# Patient Record
Sex: Male | Born: 1955 | Race: White | Hispanic: No | Marital: Married | Smoking: Former smoker
Health system: Southern US, Community
[De-identification: ages and names within clinical notes are randomized; demographics above are authoritative.]

## PROBLEM LIST (undated history)

## (undated) DIAGNOSIS — K219 Gastro-esophageal reflux disease without esophagitis: Secondary | ICD-10-CM

## (undated) DIAGNOSIS — K2270 Barrett's esophagus without dysplasia: Secondary | ICD-10-CM

## (undated) HISTORY — PX: SPINE SURGERY: SHX786

## (undated) HISTORY — DX: Barrett's esophagus without dysplasia: K22.70

## (undated) HISTORY — DX: Gastro-esophageal reflux disease without esophagitis: K21.9

## (undated) HISTORY — PX: GANGLION CYST EXCISION: SHX1691

---

## 2014-05-06 LAB — PULMONARY FUNCTION TEST

## 2015-05-04 ENCOUNTER — Telehealth: Payer: Self-pay | Admitting: Family Medicine

## 2015-05-05 ENCOUNTER — Encounter (INDEPENDENT_AMBULATORY_CARE_PROVIDER_SITE_OTHER): Payer: Self-pay

## 2015-05-05 ENCOUNTER — Ambulatory Visit (INDEPENDENT_AMBULATORY_CARE_PROVIDER_SITE_OTHER): Payer: Medicare Other | Admitting: Family Medicine

## 2015-05-05 ENCOUNTER — Encounter: Payer: Self-pay | Admitting: Family Medicine

## 2015-05-05 VITALS — BP 141/78 | HR 63 | Temp 97.4°F | Ht 69.5 in | Wt 214.0 lb

## 2015-05-05 DIAGNOSIS — M5441 Lumbago with sciatica, right side: Secondary | ICD-10-CM

## 2015-05-05 DIAGNOSIS — F329 Major depressive disorder, single episode, unspecified: Secondary | ICD-10-CM

## 2015-05-05 DIAGNOSIS — N3281 Overactive bladder: Secondary | ICD-10-CM | POA: Diagnosis not present

## 2015-05-05 DIAGNOSIS — M5442 Lumbago with sciatica, left side: Secondary | ICD-10-CM

## 2015-05-05 DIAGNOSIS — R251 Tremor, unspecified: Secondary | ICD-10-CM | POA: Diagnosis not present

## 2015-05-05 DIAGNOSIS — F32A Depression, unspecified: Secondary | ICD-10-CM

## 2015-05-05 LAB — POCT CBC
GRANULOCYTE PERCENT: 65.6 % (ref 37–80)
HEMATOCRIT: 41.1 % — AB (ref 43.5–53.7)
Hemoglobin: 14.5 g/dL (ref 14.1–18.1)
Lymph, poc: 2.2 (ref 0.6–3.4)
MCH, POC: 31.2 pg (ref 27–31.2)
MCHC: 35.2 g/dL (ref 31.8–35.4)
MCV: 88.8 fL (ref 80–97)
MPV: 6.2 fL (ref 0–99.8)
POC GRANULOCYTE: 5.2 (ref 2–6.9)
POC LYMPH PERCENT: 27.9 %L (ref 10–50)
Platelet Count, POC: 226 10*3/uL (ref 142–424)
RBC: 4.63 M/uL — AB (ref 4.69–6.13)
RDW, POC: 13.2 %
WBC: 8 10*3/uL (ref 4.6–10.2)

## 2015-05-05 MED ORDER — DULOXETINE HCL 30 MG PO CPEP: 30.0000 mg | ORAL_CAPSULE | Freq: Every day | ORAL | Status: DC

## 2015-05-05 NOTE — Telephone Encounter (Signed)
Patient has [facility] complete. He is currently taking lexapro, hydrocodone 10/325, oxybutnin, nexium, and pepcid. Patient aware that we do not prescribe chronic pain medication and is aware that we will refer him to pain management. Appointment given for today with [facility] since we did have a cancellation.

## 2015-05-05 NOTE — Progress Notes (Signed)
Subjective:  Patient ID: Austin Huber, male    DOB: Sep 25, 1956  Age: 59 y.o. MRN: [id number]  CC: Establish Care   HPI Drury Ardizzone presents for new patient evaluation. He is having a problem with a tremor. This has been gradually increasing over the last several weeks. He has a chronic back pain patient. He has had spinal surgery but currently is not taking any medication for his pain. He does have frequent urination and urinates 5-7 times a night he says. Frequency during the day as well no pain no discharge from the penis. The tremor does not involve his head or legs. It does not seem to be affected by anything he does either to improve it or make it worse. He is a very heavy coffee drinker at least 8-10 cups daily. He does have some problems with reflux and heartburn. He takes Nexium but he takes it with food. He recently relocated here from [place]. He has a sister in the [place]. There have been multiple situations that have led him to feel despondent and withdrawn and irritable as well as sad and depressed.  History [name] has a past medical history of Barrett esophagus and GERD (gastroesophageal reflux disease).   He has past surgical history that includes Spine surgery and Ganglion cyst excision.   His family history includes Aneurysm in his father; Diabetes in his brother, brother, and sister; Heart disease in his brother and brother; Hypertension in his mother; Stroke in his mother.He reports that he quit smoking about 18 months ago. He started smoking about 37 years ago. He does not have any smokeless tobacco history on file. He reports that he does not drink alcohol or use illicit drugs.  No current outpatient prescriptions on file prior to visit.   No current facility-administered medications on file prior to visit.    ROS Review of Systems  Constitutional: Negative for fever, chills and diaphoresis.  HENT: Negative for congestion, rhinorrhea and sore throat.     Respiratory: Negative for cough, shortness of breath and wheezing.   Cardiovascular: Negative for chest pain.  Gastrointestinal: Negative for nausea, vomiting, abdominal pain, diarrhea, constipation and abdominal distention.  Genitourinary: Positive for urgency and frequency. Negative for dysuria and penile pain.  Musculoskeletal: Negative for joint swelling and arthralgias.  Skin: Negative for rash.  Neurological: Negative for seizures, syncope, speech difficulty, weakness, numbness and headaches.  Psychiatric/Behavioral: Positive for sleep disturbance, dysphoric mood and decreased concentration. Negative for suicidal ideas and self-injury. The patient is nervous/anxious.     Objective:  BP 141/78 mmHg  Pulse 63  Temp(Src) 97.4 F (36.3 C) (Oral)  Ht 5' 9.5" (1.765 m)  Wt 214 lb (97.07 kg)  BMI 31.16 kg/m2  Physical Exam  Constitutional: He is oriented to person, place, and time. He appears well-developed and well-nourished. No distress.  HENT:  Head: Normocephalic and atraumatic.  Right Ear: External ear normal.  Left Ear: External ear normal.  Nose: Nose normal.  Mouth/Throat: Oropharynx is clear and moist.  Eyes: Conjunctivae and EOM are normal. Pupils are equal, round, and reactive to light.  Neck: Normal range of motion. Neck supple. No thyromegaly present.  Cardiovascular: Normal rate, regular rhythm and normal heart sounds.   No murmur heard. Pulmonary/Chest: Effort normal and breath sounds normal. No respiratory distress. He has no wheezes. He has no rales.  Abdominal: Soft. Bowel sounds are normal. He exhibits no distension. There is no tenderness.  Lymphadenopathy:    He has  no cervical adenopathy.  Neurological: He is alert and oriented to person, place, and time. He has normal reflexes.  Skin: Skin is warm and dry.  Psychiatric: He has a normal mood and affect. His behavior is normal. Judgment and thought content normal.    Assessment & Plan:   Jarel was seen  today for establish care.  Diagnoses and all orders for this visit:  Tremor Orders: -     POCT CBC -     Sedimentation rate -     Vitamin B12 -     Vit D  25 hydroxy (rtn osteoporosis monitoring) -     Folate  Overactive bladder Orders: -     CMP14+EGFR  Depression  Bilateral low back pain with sciatica, sciatica laterality unspecified  Other orders -     DULoxetine (CYMBALTA) 30 MG capsule; Take 1 capsule (30 mg total) by mouth daily. For one week then two daily. Take with a full stomach at suppertime   I have discontinued Mr. Grater escitalopram. I am also having him start on DULoxetine. Additionally, I am having him maintain his famotidine, esomeprazole, oxybutynin, and triamcinolone cream.  Meds ordered this encounter  Medications  . DISCONTD: escitalopram (LEXAPRO) 10 MG tablet    Sig: Take 10 mg by mouth daily.  . famotidine (PEPCID) 20 MG tablet    Sig: Take 20 mg by mouth 2 (two) times daily.  Marland Kitchen esomeprazole (NEXIUM) 40 MG capsule    Sig: Take 40 mg by mouth daily at 12 noon.  Marland Kitchen oxybutynin (DITROPAN) 5 MG tablet    Sig: Take 5 mg by mouth 2 (two) times daily.  Marland Kitchen triamcinolone cream (KENALOG) 0.1 %    Sig: Apply 1 application topically 2 (two) times daily.  . DULoxetine (CYMBALTA) 30 MG capsule    Sig: Take 1 capsule (30 mg total) by mouth daily. For one week then two daily. Take with a full stomach at suppertime    Dispense:  60 capsule    Refill:  0     Follow-up: Return in about 6 weeks (around 06/16/2015).  Claretta Fraise, M.D.

## 2015-05-06 LAB — CMP14+EGFR
A/G RATIO: 1.7 (ref 1.1–2.5)
ALK PHOS: 66 IU/L (ref 39–117)
ALT: 17 IU/L (ref 0–44)
AST: 20 IU/L (ref 0–40)
Albumin: 4.7 g/dL (ref 3.5–5.5)
BUN/Creatinine Ratio: 14 (ref 9–20)
BUN: 13 mg/dL (ref 6–24)
Bilirubin Total: 0.3 mg/dL (ref 0.0–1.2)
CO2: 26 mmol/L (ref 18–29)
CREATININE: 0.96 mg/dL (ref 0.76–1.27)
Calcium: 9.8 mg/dL (ref 8.7–10.2)
Chloride: 100 mmol/L (ref 97–108)
GFR calc Af Amer: 100 mL/min/{1.73_m2} (ref >59)
GFR calc non Af Amer: 86 mL/min/{1.73_m2} (ref >59)
GLOBULIN, TOTAL: 2.7 g/dL (ref 1.5–4.5)
Glucose: 61 mg/dL — ABNORMAL LOW (ref 65–99)
Potassium: 4.8 mmol/L (ref 3.5–5.2)
SODIUM: 139 mmol/L (ref 134–144)
Total Protein: 7.4 g/dL (ref 6.0–8.5)

## 2015-05-06 LAB — VITAMIN B12: Vitamin B-12: 718 pg/mL (ref 211–946)

## 2015-05-06 LAB — FOLATE: Folate: 9.9 ng/mL (ref >3.0)

## 2015-05-06 LAB — SEDIMENTATION RATE: SED RATE: 3 mm/h (ref 0–30)

## 2015-05-06 LAB — VITAMIN D 25 HYDROXY (VIT D DEFICIENCY, FRACTURES): VIT D 25 HYDROXY: 34.7 ng/mL (ref 30.0–100.0)

## 2015-06-02 ENCOUNTER — Ambulatory Visit (INDEPENDENT_AMBULATORY_CARE_PROVIDER_SITE_OTHER): Payer: Medicare Other | Admitting: Family Medicine

## 2015-06-02 ENCOUNTER — Encounter: Payer: Self-pay | Admitting: Family Medicine

## 2015-06-02 VITALS — Temp 98.3°F | Ht 69.5 in | Wt 214.0 lb

## 2015-06-02 DIAGNOSIS — M25569 Pain in unspecified knee: Secondary | ICD-10-CM | POA: Diagnosis not present

## 2015-06-02 MED ORDER — DULOXETINE HCL 30 MG PO CPEP: 30.0000 mg | ORAL_CAPSULE | Freq: Two times a day (BID) | ORAL | Status: DC

## 2015-06-02 NOTE — Progress Notes (Signed)
Subjective:  Patient ID: Austin Huber, male    DOB: 1956/03/15  Age: 59 y.o. MRN: [id number]  CC: Depression   HPI Austin Huber presents for recheck of depression, tremor and urinary incontinence. Also having elevated BP as well as knee pain. Asks to see ortho. [facility] is a "crock." Wife chimes in that his surgeon said he didn't need that, but does want him on opiates since he has had 3 surgeries.  History Austin Huber has a past medical history of Barrett esophagus and GERD (gastroesophageal reflux disease).   He has past surgical history that includes Spine surgery and Ganglion cyst excision.   His family history includes Aneurysm in his father; Diabetes in his brother, brother, and sister; Heart disease in his brother and brother; Hypertension in his mother; Stroke in his mother.He reports that he quit smoking about 19 months ago. He started smoking about 37 years ago. He does not have any smokeless tobacco history on file. He reports that he does not drink alcohol or use illicit drugs.  Outpatient Prescriptions Prior to Visit  Medication Sig Dispense Refill  . esomeprazole (NEXIUM) 40 MG capsule Take 40 mg by mouth daily at 12 noon.    . famotidine (PEPCID) 20 MG tablet Take 20 mg by mouth 2 (two) times daily.    Marland Kitchen oxybutynin (DITROPAN) 5 MG tablet Take 5 mg by mouth 2 (two) times daily.    Marland Kitchen triamcinolone cream (KENALOG) 0.1 % Apply 1 application topically 2 (two) times daily.    . DULoxetine (CYMBALTA) 30 MG capsule Take 1 capsule (30 mg total) by mouth daily. For one week then two daily. Take with a full stomach at suppertime 60 capsule 0   No facility-administered medications prior to visit.    ROS Review of Systems  Constitutional: Negative for fever, chills, diaphoresis and unexpected weight change.  HENT: Negative for congestion, hearing loss, rhinorrhea, sore throat and trouble swallowing.   Respiratory: Negative for cough, chest tightness, shortness of breath and  wheezing.   Gastrointestinal: Negative for nausea, vomiting, abdominal pain, diarrhea, constipation and abdominal distention.  Genitourinary: Negative for dysuria, hematuria and flank pain.  Musculoskeletal: Positive for back pain, joint swelling and arthralgias.  Skin: Negative for rash.  Psychiatric/Behavioral: Negative for dysphoric mood, decreased concentration and agitation. The patient is not nervous/anxious.     Objective:  Temp(Src) 98.3 F (36.8 C) (Oral)  Ht 5' 9.5" (1.765 m)  Wt 214 lb (97.07 kg)  BMI 31.16 kg/m2  BP Readings from Last 3 Encounters:  05/05/15 141/78    Wt Readings from Last 3 Encounters:  06/02/15 214 lb (97.07 kg)  05/05/15 214 lb (97.07 kg)     Physical Exam  Constitutional: He is oriented to person, place, and time. He appears well-developed and well-nourished. No distress.  HENT:  Head: Normocephalic and atraumatic.  Right Ear: External ear normal.  Left Ear: External ear normal.  Nose: Nose normal.  Mouth/Throat: Oropharynx is clear and moist.  Eyes: Conjunctivae and EOM are normal. Pupils are equal, round, and reactive to light.  Neck: Normal range of motion. Neck supple. No thyromegaly present.  Cardiovascular: Normal rate, regular rhythm and normal heart sounds.   No murmur heard. Pulmonary/Chest: Effort normal and breath sounds normal. No respiratory distress. He has no wheezes. He has no rales.  Abdominal: Soft. Bowel sounds are normal. He exhibits no distension. There is no tenderness.  Lymphadenopathy:    He has no cervical adenopathy.  Neurological: He is alert  and oriented to person, place, and time. He has normal reflexes.  Skin: Skin is warm and dry.  Psychiatric: He has a normal mood and affect. His behavior is normal. Judgment and thought content normal.    No results found for: HGBA1C  Lab Results  Component Value Date   WBC 8.0 05/05/2015   HGB 14.5 05/05/2015   HCT 41.1* 05/05/2015   GLUCOSE 61* 05/05/2015   ALT 17  05/05/2015   AST 20 05/05/2015   NA 139 05/05/2015   K 4.8 05/05/2015   CL 100 05/05/2015   CREATININE 0.96 05/05/2015   BUN 13 05/05/2015   CO2 26 05/05/2015    Patient was never admitted.  Assessment & Plan:   Austin Huber was seen today for depression.  Diagnoses and all orders for this visit:  Knee joint pain, unspecified laterality Orders: -     Ambulatory referral to Orthopedic Surgery  Other orders -     DULoxetine (CYMBALTA) 30 MG capsule; Take 1 capsule (30 mg total) by mouth 2 (two) times daily. For one week then two daily. Take with a full stomach at suppertime  Over 30 min. Spent with pt. Over half of which was spent in discussion of pain and appropriate referrals and interventions.    Follow-up: Return if symptoms worsen or fail to improve.  Claretta Fraise, M.D.

## 2015-06-16 ENCOUNTER — Other Ambulatory Visit: Payer: Self-pay | Admitting: *Deleted

## 2015-06-16 MED ORDER — FAMOTIDINE 20 MG PO TABS: 20.0000 mg | ORAL_TABLET | Freq: Two times a day (BID) | ORAL | Status: DC

## 2015-08-11 ENCOUNTER — Other Ambulatory Visit: Payer: Self-pay | Admitting: Family Medicine

## 2015-08-11 MED ORDER — OXYBUTYNIN CHLORIDE 5 MG PO TABS: 5.0000 mg | ORAL_TABLET | Freq: Two times a day (BID) | ORAL | Status: DC

## 2015-08-11 MED ORDER — ESOMEPRAZOLE MAGNESIUM 40 MG PO CPDR: 40.0000 mg | DELAYED_RELEASE_CAPSULE | Freq: Every day | ORAL | Status: DC

## 2015-08-11 NOTE — Telephone Encounter (Signed)
Patient aware that rx has been sent to pharmacy.  °

## 2015-08-12 ENCOUNTER — Other Ambulatory Visit: Payer: Self-pay

## 2015-08-12 MED ORDER — ESOMEPRAZOLE MAGNESIUM 40 MG PO CPDR: 40.0000 mg | DELAYED_RELEASE_CAPSULE | Freq: Every day | ORAL | Status: DC

## 2015-08-21 ENCOUNTER — Telehealth: Payer: Self-pay | Admitting: Family Medicine

## 2015-08-21 DIAGNOSIS — R52 Pain, unspecified: Secondary | ICD-10-CM

## 2015-08-21 NOTE — Telephone Encounter (Signed)
Patient aware that referral for pain clinic has been sent to the pharmacy.  Informed patient if he has not heard anything about referral within a week to two weeks to give [place] a call back.  Patient verbalized understanding.

## 2015-08-21 NOTE — Telephone Encounter (Signed)
That sounds like a great idea.Please put in the referral.

## 2015-08-30 ENCOUNTER — Other Ambulatory Visit: Payer: Self-pay | Admitting: Family Medicine

## 2015-09-03 DIAGNOSIS — Z79899 Other long term (current) drug therapy: Secondary | ICD-10-CM | POA: Diagnosis not present

## 2015-09-03 DIAGNOSIS — G8929 Other chronic pain: Secondary | ICD-10-CM | POA: Diagnosis not present

## 2015-09-03 DIAGNOSIS — M961 Postlaminectomy syndrome, not elsewhere classified: Secondary | ICD-10-CM | POA: Diagnosis not present

## 2015-09-03 DIAGNOSIS — M47816 Spondylosis without myelopathy or radiculopathy, lumbar region: Secondary | ICD-10-CM | POA: Diagnosis not present

## 2015-09-03 DIAGNOSIS — M545 Low back pain: Secondary | ICD-10-CM | POA: Diagnosis not present

## 2015-09-04 ENCOUNTER — Ambulatory Visit (INDEPENDENT_AMBULATORY_CARE_PROVIDER_SITE_OTHER): Payer: Medicare Other | Admitting: Family Medicine

## 2015-09-04 ENCOUNTER — Encounter: Payer: Self-pay | Admitting: Family Medicine

## 2015-09-04 VITALS — BP 192/104 | HR 60 | Temp 98.4°F | Ht 70.0 in | Wt 218.6 lb

## 2015-09-04 DIAGNOSIS — M2550 Pain in unspecified joint: Secondary | ICD-10-CM

## 2015-09-04 DIAGNOSIS — E291 Testicular hypofunction: Secondary | ICD-10-CM | POA: Diagnosis not present

## 2015-09-04 MED ORDER — OXYBUTYNIN CHLORIDE 5 MG PO TABS: 5.0000 mg | ORAL_TABLET | Freq: Two times a day (BID) | ORAL | Status: DC

## 2015-09-04 MED ORDER — ESOMEPRAZOLE MAGNESIUM 40 MG PO CPDR: 40.0000 mg | DELAYED_RELEASE_CAPSULE | Freq: Every day | ORAL | Status: DC

## 2015-09-04 MED ORDER — NEBIVOLOL HCL 10 MG PO TABS: 10.0000 mg | ORAL_TABLET | Freq: Every day | ORAL | Status: DC

## 2015-09-04 MED ORDER — DULOXETINE HCL 30 MG PO CPEP: 30.0000 mg | ORAL_CAPSULE | Freq: Two times a day (BID) | ORAL | Status: DC

## 2015-09-04 NOTE — Progress Notes (Signed)
Subjective:  Patient ID: Austin Huber, male    DOB: [birth year]  Age: 59 y.o. MRN: [id number]  CC: Depression; Joint Pain; Over Active Bladder; and Gastroesophageal Reflux   HPI Austin Huber presents for  follow-up of hypertension. Patient has no history of headache chest pain or shortness of breath or recent cough. Patient also denies symptoms of TIA such as numbness weakness lateralizing. Patient checks  blood pressure at home and has not had any elevated readings recently. Patient denies side effects from his medication. States taking it regularly.  Son had Lyme Dx. Pt. Having 8/10 pain at fingers, wrists and feet. Aching with shooting sensation. Hard to walk on it. Duloxetine not causing side effects. Feeling better. Depressed, down and out feeling is much better.Has to separate the dose to BID to avoid nausea.   Saw pain management yesterday and resumed hydrocodone. Only takes it when he has to. Says people are turning to heroin.  Patient in for follow-up of GERD. Currently asymptomatic taking  PPI daily. There is no chest pain or heartburn. No hematemesis and no melena. No dysphagia or choking. Onset is remote. Progression is stable. Complicating factors, none.    History Austin Huber has a past medical history of Barrett esophagus and GERD (gastroesophageal reflux disease).   He has past surgical history that includes Spine surgery and Ganglion cyst excision.   His family history includes Aneurysm in his father; Aortic aneurysm in his mother; Diabetes in his brother, brother, and sister; Heart disease in his brother and brother; Hypertension in his mother; Stroke in his father.He reports that he quit smoking about 22 months ago. He started smoking about 37 years ago. He does not have any smokeless tobacco history on file. He reports that he does not drink alcohol or use illicit drugs.  Current Outpatient Prescriptions on File Prior to Visit  Medication Sig Dispense Refill  . famotidine (PEPCID)  20 MG tablet Take 1 tablet (20 mg total) by mouth 2 (two) times daily. 60 tablet 4  . HYDROcodone-acetaminophen (NORCO) 10-325 MG per tablet Take 1 tablet by mouth.    . triamcinolone cream (KENALOG) 0.1 % Apply 1 application topically 2 (two) times daily.     No current facility-administered medications on file prior to visit.    ROS Review of Systems  Constitutional: Negative for fever, chills and diaphoresis.  HENT: Negative for congestion, rhinorrhea and sore throat.   Respiratory: Negative for cough, shortness of breath and wheezing.   Cardiovascular: Negative for chest pain.  Gastrointestinal: Negative for nausea, vomiting, abdominal pain, diarrhea, constipation and abdominal distention.  Genitourinary: Negative for dysuria and frequency.  Musculoskeletal: Negative for joint swelling and arthralgias.  Skin: Negative for rash.  Neurological: Negative for headaches.    Objective:  BP 192/104 mmHg  Pulse 60  Temp(Src) 98.4 F (36.9 C) (Oral)  Ht 5\' 10"  (1.778 m)  Wt 218 lb 9.6 oz (99.156 kg)  BMI 31.37 kg/m2  BP Readings from Last 3 Encounters:  09/04/15 192/104  05/05/15 141/78    Wt Readings from Last 3 Encounters:  09/04/15 218 lb 9.6 oz (99.156 kg)  06/02/15 214 lb (97.07 kg)  05/05/15 214 lb (97.07 kg)     Physical Exam  Constitutional: He is oriented to person, place, and time. He appears well-developed and well-nourished. No distress.  HENT:  Head: Normocephalic and atraumatic.  Right Ear: External ear normal.  Left Ear: External ear normal.  Nose: Nose normal.  Mouth/Throat: Oropharynx is clear and moist.  Eyes: Conjunctivae and EOM are normal. Pupils are equal, round, and reactive to light.  Neck: Normal range of motion. Neck supple. No thyromegaly present.  Cardiovascular: Normal rate, regular rhythm and normal heart sounds.   No murmur heard. Pulmonary/Chest: Effort normal and breath sounds normal. No respiratory distress. He has no wheezes. He has  no rales.  Abdominal: Soft. Bowel sounds are normal. He exhibits no distension. There is no tenderness.  Lymphadenopathy:    He has no cervical adenopathy.  Neurological: He is alert and oriented to person, place, and time. He has normal reflexes.  Skin: Skin is warm and dry.  Psychiatric: He has a normal mood and affect. His behavior is normal. Judgment and thought content normal.    No results found for: HGBA1C  Lab Results  Component Value Date   WBC 8.0 05/05/2015   HGB 14.5 05/05/2015   HCT 41.1* 05/05/2015   GLUCOSE 61* 05/05/2015   ALT 17 05/05/2015   AST 20 05/05/2015   NA 139 05/05/2015   K 4.8 05/05/2015   CL 100 05/05/2015   CREATININE 0.96 05/05/2015   BUN 13 05/05/2015   CO2 26 05/05/2015    Patient was never admitted.  Assessment & Plan:   Austin Huber was seen today for depression, joint pain, over active bladder and gastroesophageal reflux.  Diagnoses and all orders for this visit:  Arthralgia -     Lyme Ab/Western Blot Reflex -     Arthritis Panel -     Rheumatoid factor -     RPR -     Cancel: Sedimentation rate -     Cancel: CBC with Differential/Platelet  Hypogonadism in male -     Testosterone,Free and Total  Other orders -     esomeprazole (NEXIUM) 40 MG capsule; Take 1 capsule (40 mg total) by mouth daily at 12 noon. -     oxybutynin (DITROPAN) 5 MG tablet; Take 1 tablet (5 mg total) by mouth 2 (two) times daily. -     DULoxetine (CYMBALTA) 30 MG capsule; Take 1 capsule (30 mg total) by mouth 2 (two) times daily. -     nebivolol (BYSTOLIC) 10 MG tablet; Take 1 tablet (10 mg total) by mouth daily.   I have changed Austin Huber DULoxetine. I am also having him start on nebivolol. Additionally, I am having him maintain his triamcinolone cream, HYDROcodone-acetaminophen, famotidine, esomeprazole, and oxybutynin.  Meds ordered this encounter  Medications  . esomeprazole (NEXIUM) 40 MG capsule    Sig: Take 1 capsule (40 mg total) by mouth daily at 12  noon.    Dispense:  90 capsule    Refill:  3  . oxybutynin (DITROPAN) 5 MG tablet    Sig: Take 1 tablet (5 mg total) by mouth 2 (two) times daily.    Dispense:  180 tablet    Refill:  1  . DULoxetine (CYMBALTA) 30 MG capsule    Sig: Take 1 capsule (30 mg total) by mouth 2 (two) times daily.    Dispense:  180 capsule    Refill:  1  . nebivolol (BYSTOLIC) 10 MG tablet    Sig: Take 1 tablet (10 mg total) by mouth daily.    Dispense:  30 tablet    Refill:  2     Follow-up: Return in about 1 week (around 09/11/2015) for Arthritis.  Claretta Fraise, M.D.

## 2015-09-07 LAB — ARTHRITIS PANEL
BASOS: 1 %
Basophils Absolute: 0.1 10*3/uL (ref 0.0–0.2)
EOS (ABSOLUTE): 0.2 10*3/uL (ref 0.0–0.4)
EOS: 3 %
HEMOGLOBIN: 14.5 g/dL (ref 12.6–17.7)
Hematocrit: 43 % (ref 37.5–51.0)
IMMATURE GRANS (ABS): 0 10*3/uL (ref 0.0–0.1)
IMMATURE GRANULOCYTES: 0 %
LYMPHS: 33 %
Lymphocytes Absolute: 1.9 10*3/uL (ref 0.7–3.1)
MCH: 28.6 pg (ref 26.6–33.0)
MCHC: 33.7 g/dL (ref 31.5–35.7)
MCV: 85 fL (ref 79–97)
Monocytes Absolute: 0.6 10*3/uL (ref 0.1–0.9)
Monocytes: 10 %
NEUTROS ABS: 3.1 10*3/uL (ref 1.4–7.0)
NEUTROS PCT: 53 %
Platelets: 201 10*3/uL (ref 150–379)
RBC: 5.07 x10E6/uL (ref 4.14–5.80)
RDW: 13.5 % (ref 12.3–15.4)
Rhuematoid fact SerPl-aCnc: <10 IU/mL (ref 0.0–13.9)
SED RATE: 2 mm/h (ref 0–30)
URIC ACID: 6.2 mg/dL (ref 3.7–8.6)
WBC: 5.9 10*3/uL (ref 3.4–10.8)

## 2015-09-07 LAB — TESTOSTERONE,FREE AND TOTAL
TESTOSTERONE FREE: 5 pg/mL — AB (ref 7.2–24.0)
TESTOSTERONE: 353 ng/dL (ref 348–1197)

## 2015-09-07 LAB — LYME AB/WESTERN BLOT REFLEX
LYME DISEASE AB, QUANT, IGM: <0.8 index (ref 0.00–0.79)
Lyme IgG/IgM Ab: <0.91 {ISR} (ref 0.00–0.90)

## 2015-09-07 LAB — RPR: RPR Ser Ql: NONREACTIVE

## 2015-09-08 NOTE — Addendum Note (Signed)
Addended by: Wardell Heath on: 09/08/2015 09:02 AM   Modules accepted: Orders

## 2015-09-11 ENCOUNTER — Encounter: Payer: Self-pay | Admitting: Family Medicine

## 2015-09-11 ENCOUNTER — Ambulatory Visit (INDEPENDENT_AMBULATORY_CARE_PROVIDER_SITE_OTHER): Payer: Medicare Other

## 2015-09-11 ENCOUNTER — Ambulatory Visit (INDEPENDENT_AMBULATORY_CARE_PROVIDER_SITE_OTHER): Payer: Medicare Other | Admitting: Family Medicine

## 2015-09-11 VITALS — BP 139/80 | HR 60 | Temp 97.7°F | Ht 70.0 in | Wt 217.6 lb

## 2015-09-11 DIAGNOSIS — I10 Essential (primary) hypertension: Secondary | ICD-10-CM | POA: Diagnosis not present

## 2015-09-11 DIAGNOSIS — M2550 Pain in unspecified joint: Secondary | ICD-10-CM | POA: Diagnosis not present

## 2015-09-11 NOTE — Addendum Note (Signed)
Addended by: Claretta Fraise on: 09/11/2015 10:37 AM   Modules accepted: Miquel Dunn

## 2015-09-11 NOTE — Progress Notes (Addendum)
Subjective:  Patient ID: Austin Huber, male    DOB: 25-May-1956  Age: 59 y.o. MRN: [id number]  CC: Joint Pain and Hypertension   HPI Austin Huber presents for finger still hurting in spite of the use of hydrocodone. He understands the limitation pain is not the goal of pain management but he would like to have better relief of the fingers. He still having some pain in the knees as well. These pains are chronic. 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 Arthritis panel blood work including Lyme's titer were reviewed with him.   follow-up of hypertension. Patient has no history of headache chest pain or shortness of breath or recent cough. Patient also denies symptoms of TIA such as numbness weakness lateralizing. Patient checks  blood pressure at home and has not had any elevated readings recently. Patient denies side effects from his medication. States taking it regularly.   History Austin Huber has a past medical history of Barrett esophagus and GERD (gastroesophageal reflux disease).   He has past surgical history that includes Spine surgery and Ganglion cyst excision.   His family history includes Aneurysm in his father; Aortic aneurysm in his mother; Diabetes in his brother, brother, and sister; Heart disease in his brother and brother; Hypertension in his mother; Stroke in his father.He reports that he quit smoking about 22 months ago. He started smoking about 37 years ago. He does not have any smokeless tobacco history on file. He reports that he does not drink alcohol or use illicit drugs.  Outpatient Prescriptions Prior to Visit  Medication Sig Dispense Refill  . DULoxetine (CYMBALTA) 30 MG capsule Take 1 capsule (30 mg total) by mouth 2 (two) times daily. 180 capsule 1  . esomeprazole (NEXIUM) 40 MG capsule Take 1 capsule (40 mg total) by mouth daily at 12 noon. 90 capsule 3  . famotidine (PEPCID) 20 MG tablet Take 1 tablet (20 mg total) by mouth 2 (two) times daily. 60 tablet 4    . nebivolol (BYSTOLIC) 10 MG tablet Take 1 tablet (10 mg total) by mouth daily. 30 tablet 2  . oxybutynin (DITROPAN) 5 MG tablet Take 1 tablet (5 mg total) by mouth 2 (two) times daily. 180 tablet 1  . triamcinolone cream (KENALOG) 0.1 % Apply 1 application topically 2 (two) times daily.    Marland Kitchen HYDROcodone-acetaminophen (NORCO) 10-325 MG per tablet Take 1 tablet by mouth.     No facility-administered medications prior to visit.    ROS Review of Systems  Constitutional: Negative for fever, chills and diaphoresis.  HENT: Negative for congestion, rhinorrhea and sore throat.   Respiratory: Negative for cough, shortness of breath and wheezing.   Cardiovascular: Negative for chest pain.  Gastrointestinal: Negative for nausea, vomiting, abdominal pain, diarrhea, constipation and abdominal distention.  Genitourinary: Negative for dysuria and frequency.  Musculoskeletal: Positive for myalgias and arthralgias. Negative for joint swelling.  Skin: Negative for rash.  Neurological: Negative for headaches.    Objective:  BP 139/80 mmHg  Pulse 60  Temp(Src) 97.7 F (36.5 C) (Oral)  Ht 5\' 10"  (1.778 m)  Wt 217 lb 9.6 oz (98.703 kg)  BMI 31.22 kg/m2  BP Readings from Last 3 Encounters:  09/11/15 139/80  09/04/15 192/104  05/05/15 141/78    Wt Readings from Last 3 Encounters:  09/11/15 217 lb 9.6 oz (98.703 kg)  09/04/15 218 lb 9.6 oz (99.156 kg)  06/02/15 214 lb (97.07 kg)     Physical Exam  Constitutional: He  appears well-developed and well-nourished.  HENT:  Head: Normocephalic and atraumatic.  Right Ear: Tympanic membrane and external ear normal. No decreased hearing is noted.  Left Ear: Tympanic membrane and external ear normal. No decreased hearing is noted.  Mouth/Throat: No oropharyngeal exudate or posterior oropharyngeal erythema.  Eyes: Pupils are equal, round, and reactive to light.  Neck: Normal range of motion. Neck supple.  Cardiovascular: Normal rate and regular  rhythm.   No murmur heard. Pulmonary/Chest: Breath sounds normal. No respiratory distress.  Abdominal: Soft. Bowel sounds are normal. He exhibits no mass. There is no tenderness.  Vitals reviewed.   No results found for: HGBA1C  Lab Results  Component Value Date   WBC 5.9 09/04/2015   HGB 14.5 05/05/2015   HCT 43.0 09/04/2015   GLUCOSE 61* 05/05/2015   ALT 17 05/05/2015   AST 20 05/05/2015   NA 139 05/05/2015   K 4.8 05/05/2015   CL 100 05/05/2015   CREATININE 0.96 05/05/2015   BUN 13 05/05/2015   CO2 26 05/05/2015    Patient was never admitted.  Assessment & Plan:   Austin Huber was seen today for joint pain and hypertension.  Diagnoses and all orders for this visit:  Arthralgia -     DG Hand Complete Left; Future -     DG Hand Complete Right; Future  Essential hypertension   I have changed Austin Huber HYDROcodone-acetaminophen. I am also having him maintain his triamcinolone cream, famotidine, esomeprazole, oxybutynin, DULoxetine, and nebivolol.  Meds ordered this encounter  Medications  . HYDROcodone-acetaminophen (NORCO) 10-325 MG tablet    Sig: Take 1 tablet by mouth 2 (two) times daily.    Dispense:  60 tablet   Influenza and Prevnar vaccines administered today  Follow-up: Return in about 3 months (around 12/12/2015).  Claretta Fraise, M.D.

## 2015-09-11 NOTE — Addendum Note (Signed)
Addended by: Claretta Fraise on: 09/11/2015 10:36 AM   Modules accepted: Orders, SmartSet

## 2015-09-17 ENCOUNTER — Telehealth: Payer: Self-pay | Admitting: Family Medicine

## 2015-09-17 DIAGNOSIS — M2550 Pain in unspecified joint: Secondary | ICD-10-CM

## 2015-09-17 NOTE — Telephone Encounter (Signed)
Pt is having continued joint pain and would like some additional labwork He would also like to be checked for RMSF Please advise

## 2015-09-18 NOTE — Telephone Encounter (Signed)
I placed the order. Have him drop by. Thanks, WS.

## 2015-09-18 NOTE — Telephone Encounter (Signed)
Pt is aware and he states he will come by in the morning. Advised pt we are only open until 12 tomorrow. Pt voiced understanding.

## 2015-09-18 NOTE — Addendum Note (Signed)
Addended by: Claretta Fraise on: 09/18/2015 11:23 AM   Modules accepted: Orders

## 2015-09-21 ENCOUNTER — Other Ambulatory Visit (INDEPENDENT_AMBULATORY_CARE_PROVIDER_SITE_OTHER): Payer: Medicare Other

## 2015-09-21 DIAGNOSIS — M2550 Pain in unspecified joint: Secondary | ICD-10-CM | POA: Diagnosis not present

## 2015-09-21 DIAGNOSIS — E291 Testicular hypofunction: Secondary | ICD-10-CM

## 2015-09-21 DIAGNOSIS — B999 Unspecified infectious disease: Secondary | ICD-10-CM

## 2015-09-21 NOTE — Progress Notes (Signed)
Lab only 

## 2015-09-23 ENCOUNTER — Other Ambulatory Visit: Payer: Self-pay | Admitting: Family Medicine

## 2015-09-23 LAB — TESTOSTERONE,FREE AND TOTAL
TESTOSTERONE FREE: 5.9 pg/mL — AB (ref 7.2–24.0)
Testosterone: 302 ng/dL — ABNORMAL LOW (ref 348–1197)

## 2015-09-23 LAB — ROCKY MTN SPOTTED FVR ABS PNL(IGG+IGM)
RMSF IGG: NEGATIVE
RMSF IGM: 0.96 {index} — AB (ref 0.00–0.89)

## 2015-09-23 MED ORDER — ANDROGEL 20.25 MG/1.25GM (1.62%) TD GEL
TRANSDERMAL | Status: DC
Start: 2015-09-23 — End: 2016-03-25

## 2015-09-23 NOTE — Addendum Note (Signed)
Addended by: Shelbie Ammons on: 09/23/2015 12:54 PM   Modules accepted: Orders

## 2015-09-30 DIAGNOSIS — G8929 Other chronic pain: Secondary | ICD-10-CM | POA: Diagnosis not present

## 2015-09-30 DIAGNOSIS — M545 Low back pain: Secondary | ICD-10-CM | POA: Diagnosis not present

## 2015-09-30 DIAGNOSIS — Z79899 Other long term (current) drug therapy: Secondary | ICD-10-CM | POA: Diagnosis not present

## 2015-09-30 DIAGNOSIS — F329 Major depressive disorder, single episode, unspecified: Secondary | ICD-10-CM | POA: Diagnosis not present

## 2015-09-30 DIAGNOSIS — M961 Postlaminectomy syndrome, not elsewhere classified: Secondary | ICD-10-CM | POA: Diagnosis not present

## 2015-10-05 ENCOUNTER — Telehealth: Payer: Self-pay

## 2015-10-05 NOTE — Telephone Encounter (Signed)
Insurance prior authorized Androgel Pump

## 2015-10-21 ENCOUNTER — Telehealth: Payer: Self-pay | Admitting: Family Medicine

## 2015-10-21 NOTE — Telephone Encounter (Signed)
Spoke to pt

## 2015-11-13 DIAGNOSIS — I10 Essential (primary) hypertension: Secondary | ICD-10-CM | POA: Diagnosis not present

## 2015-11-13 DIAGNOSIS — Z01810 Encounter for preprocedural cardiovascular examination: Secondary | ICD-10-CM | POA: Diagnosis not present

## 2015-11-13 DIAGNOSIS — K210 Gastro-esophageal reflux disease with esophagitis: Secondary | ICD-10-CM | POA: Diagnosis not present

## 2015-11-13 DIAGNOSIS — K2270 Barrett's esophagus without dysplasia: Secondary | ICD-10-CM | POA: Diagnosis not present

## 2015-11-18 DIAGNOSIS — K2270 Barrett's esophagus without dysplasia: Secondary | ICD-10-CM | POA: Diagnosis not present

## 2015-11-18 DIAGNOSIS — D131 Benign neoplasm of stomach: Secondary | ICD-10-CM | POA: Diagnosis not present

## 2015-11-18 DIAGNOSIS — Z5181 Encounter for therapeutic drug level monitoring: Secondary | ICD-10-CM | POA: Diagnosis not present

## 2015-11-18 DIAGNOSIS — I10 Essential (primary) hypertension: Secondary | ICD-10-CM | POA: Diagnosis not present

## 2015-11-18 DIAGNOSIS — K2950 Unspecified chronic gastritis without bleeding: Secondary | ICD-10-CM | POA: Diagnosis not present

## 2015-11-18 DIAGNOSIS — K209 Esophagitis, unspecified: Secondary | ICD-10-CM | POA: Diagnosis not present

## 2015-11-23 ENCOUNTER — Other Ambulatory Visit: Payer: Self-pay | Admitting: Family Medicine

## 2015-11-26 DIAGNOSIS — Z79899 Other long term (current) drug therapy: Secondary | ICD-10-CM | POA: Diagnosis not present

## 2015-11-26 DIAGNOSIS — G8929 Other chronic pain: Secondary | ICD-10-CM | POA: Diagnosis not present

## 2015-11-26 DIAGNOSIS — M47816 Spondylosis without myelopathy or radiculopathy, lumbar region: Secondary | ICD-10-CM | POA: Diagnosis not present

## 2015-11-26 DIAGNOSIS — M25569 Pain in unspecified knee: Secondary | ICD-10-CM | POA: Diagnosis not present

## 2015-11-26 DIAGNOSIS — M179 Osteoarthritis of knee, unspecified: Secondary | ICD-10-CM | POA: Diagnosis not present

## 2015-11-26 DIAGNOSIS — M961 Postlaminectomy syndrome, not elsewhere classified: Secondary | ICD-10-CM | POA: Diagnosis not present

## 2015-12-15 ENCOUNTER — Encounter: Payer: Self-pay | Admitting: Family Medicine

## 2015-12-15 ENCOUNTER — Ambulatory Visit (INDEPENDENT_AMBULATORY_CARE_PROVIDER_SITE_OTHER): Payer: Medicare Other | Admitting: Family Medicine

## 2015-12-15 VITALS — BP 151/87 | HR 50 | Temp 97.7°F | Ht 70.0 in | Wt 221.0 lb

## 2015-12-15 DIAGNOSIS — R7303 Prediabetes: Secondary | ICD-10-CM | POA: Diagnosis not present

## 2015-12-15 DIAGNOSIS — E291 Testicular hypofunction: Secondary | ICD-10-CM

## 2015-12-15 DIAGNOSIS — E162 Hypoglycemia, unspecified: Secondary | ICD-10-CM

## 2015-12-15 DIAGNOSIS — R001 Bradycardia, unspecified: Secondary | ICD-10-CM | POA: Diagnosis not present

## 2015-12-15 LAB — POCT GLYCOSYLATED HEMOGLOBIN (HGB A1C): HEMOGLOBIN A1C: 5.7

## 2015-12-15 MED ORDER — VALSARTAN 160 MG PO TABS: 160.0000 mg | ORAL_TABLET | Freq: Every day | ORAL | Status: DC

## 2015-12-15 NOTE — Patient Instructions (Addendum)
Decrease bystolic to 5 mg daily for 1 week, then DC

## 2015-12-15 NOTE — Progress Notes (Signed)
Subjective:  Patient ID: Austin Huber, male    DOB: 1956/10/10  Age: 60 y.o. MRN: [id number]  CC: Hypertension and testosterone deficiency   HPI Austin Huber presents for  follow-up of hypertension. Patient has no history of headache chest pain or shortness of breath or recent cough. Patient also denies symptoms of TIA such as numbness weakness lateralizing. Patient checks  blood pressure at home and hashad  elevated readings recently. Patient notes pulse falls into 40s. Nurse at recent Endo said pulse is too low. Recommended follow up here. States taking med regularly.  Taking testosterone for several months. Not sure if it is helping. Overall feels good. Energy and strength are better.  Pt. Concerned about risk for diabetes due to episodes of shakiness occuring occasionally. Strong family hx of diabetes noted. Would like check for DM. Denies any LOC. Not using glucose for relief. Sx last only a few minutes.    HistoryDale has a past medical history of Barrett esophagus and GERD (gastroesophageal reflux disease).   Austin Huber has past surgical history that includes Spine surgery and Ganglion cyst excision.   His family history includes Aneurysm in his father; Aortic aneurysm in his mother; Diabetes in his brother, brother, and sister; Heart disease in his brother and brother; Hypertension in his mother; Stroke in his father.Austin Huber reports that Austin Huber quit smoking about 2 years ago. Austin Huber started smoking about 37 years ago. Austin Huber does not have any smokeless tobacco history on file. Austin Huber reports that Austin Huber does not drink alcohol or use illicit drugs.  Current Outpatient Prescriptions on File Prior to Visit  Medication Sig Dispense Refill  . ANDROGEL 20.25 MG/1.25GM (1.62%) GEL Apply 2 pumps to the upper chest and shoulders daily for testosterone supplement. 150 g 5  . BYSTOLIC 10 MG tablet TAKE ONE TABLET BY MOUTH ONCE DAILY 30 tablet 3  . DULoxetine (CYMBALTA) 30 MG capsule Take 1 capsule (30 mg total) by mouth 2 (two)  times daily. 180 capsule 1  . esomeprazole (NEXIUM) 40 MG capsule Take 1 capsule (40 mg total) by mouth daily at 12 noon. 90 capsule 3  . famotidine (PEPCID) 20 MG tablet Take 1 tablet (20 mg total) by mouth 2 (two) times daily. 60 tablet 4  . HYDROcodone-acetaminophen (NORCO) 10-325 MG tablet Take 1 tablet by mouth 2 (two) times daily. 60 tablet   . oxybutynin (DITROPAN) 5 MG tablet Take 1 tablet (5 mg total) by mouth 2 (two) times daily. 180 tablet 1  . triamcinolone cream (KENALOG) 0.1 % Apply 1 application topically 2 (two) times daily.     No current facility-administered medications on file prior to visit.    ROS Review of Systems  Constitutional: Negative for fever, chills, diaphoresis and unexpected weight change.  HENT: Negative for congestion, hearing loss, rhinorrhea and sore throat.   Eyes: Negative for visual disturbance.  Respiratory: Negative for cough and shortness of breath.   Cardiovascular: Negative for chest pain.  Gastrointestinal: Negative for abdominal pain, diarrhea and constipation.  Genitourinary: Negative for dysuria and flank pain.  Musculoskeletal: Negative for joint swelling and arthralgias.  Skin: Negative for rash.  Neurological: Negative for dizziness and headaches.  Psychiatric/Behavioral: Negative for sleep disturbance and dysphoric mood.    Objective:  BP 151/87 mmHg  Pulse 50  Temp(Src) 97.7 F (36.5 C) (Oral)  Ht 5' 10"  (1.778 m)  Wt 221 lb (100.245 kg)  BMI 31.71 kg/m2  SpO2 96%  BP Readings from Last 3 Encounters:  12/15/15 151/87  09/11/15  139/80  09/04/15 192/104    Wt Readings from Last 3 Encounters:  12/15/15 221 lb (100.245 kg)  09/11/15 217 lb 9.6 oz (98.703 kg)  09/04/15 218 lb 9.6 oz (99.156 kg)     Physical Exam  Constitutional: Austin Huber is oriented to person, place, and time. Austin Huber appears well-developed and well-nourished. No distress.  HENT:  Head: Normocephalic and atraumatic.  Right Ear: External ear normal.  Left Ear:  External ear normal.  Nose: Nose normal.  Mouth/Throat: Oropharynx is clear and moist.  Eyes: Conjunctivae and EOM are normal. Pupils are equal, round, and reactive to light.  Neck: Normal range of motion. Neck supple. No thyromegaly present.  Cardiovascular: Normal rate, regular rhythm and normal heart sounds.   No murmur heard. Pulmonary/Chest: Effort normal and breath sounds normal. No respiratory distress. Austin Huber has no wheezes. Austin Huber has no rales.  Abdominal: Soft. Bowel sounds are normal. Austin Huber exhibits no distension. There is no tenderness.  Lymphadenopathy:    Austin Huber has no cervical adenopathy.  Neurological: Austin Huber is alert and oriented to person, place, and time. Austin Huber has normal reflexes.  Skin: Skin is warm and dry.  Psychiatric: Austin Huber has a normal mood and affect. His behavior is normal. Judgment and thought content normal.    Lab Results  Component Value Date   HGBA1C 5.7 12/15/2015    Lab Results  Component Value Date   WBC 5.9 09/04/2015   HGB 14.5 05/05/2015   HCT 43.0 09/04/2015   PLT 201 09/04/2015   GLUCOSE 61* 05/05/2015   ALT 17 05/05/2015   AST 20 05/05/2015   NA 139 05/05/2015   K 4.8 05/05/2015   CL 100 05/05/2015   CREATININE 0.96 05/05/2015   BUN 13 05/05/2015   CO2 26 05/05/2015   HGBA1C 5.7 12/15/2015    Patient was never admitted.  Assessment & Plan:   Austin Huber was seen today for hypertension and testosterone deficiency.  Diagnoses and all orders for this visit:  Hypogonadism in male -     CBC with Differential/Platelet -     CMP14+EGFR -     Testosterone,Free and Total  Hypoglycemia -     CBC with Differential/Platelet -     CMP14+EGFR -     POCT glycosylated hemoglobin (Hb A1C)  Bradycardia -     CBC with Differential/Platelet -     CMP14+EGFR  Prediabetes  Other orders -     valsartan (DIOVAN) 160 MG tablet; Take 1 tablet (160 mg total) by mouth daily.   I am having Austin Huber start on valsartan. I am also having him maintain his triamcinolone  cream, famotidine, esomeprazole, oxybutynin, DULoxetine, HYDROcodone-acetaminophen, ANDROGEL, and BYSTOLIC.  Meds ordered this encounter  Medications  . valsartan (DIOVAN) 160 MG tablet    Sig: Take 1 tablet (160 mg total) by mouth daily.    Dispense:  30 tablet    Refill:  1     Follow-up: Return in about 1 month (around 01/15/2016).  Claretta Fraise, M.D.

## 2015-12-16 LAB — CBC WITH DIFFERENTIAL/PLATELET
BASOS: 1 %
Basophils Absolute: 0.1 10*3/uL (ref 0.0–0.2)
EOS (ABSOLUTE): 0.2 10*3/uL (ref 0.0–0.4)
Eos: 4 %
HEMOGLOBIN: 15.2 g/dL (ref 12.6–17.7)
Hematocrit: 43.6 % (ref 37.5–51.0)
IMMATURE GRANS (ABS): 0 10*3/uL (ref 0.0–0.1)
IMMATURE GRANULOCYTES: 0 %
LYMPHS: 32 %
Lymphocytes Absolute: 1.8 10*3/uL (ref 0.7–3.1)
MCH: 30 pg (ref 26.6–33.0)
MCHC: 34.9 g/dL (ref 31.5–35.7)
MCV: 86 fL (ref 79–97)
MONOCYTES: 7 %
Monocytes Absolute: 0.4 10*3/uL (ref 0.1–0.9)
NEUTROS ABS: 3.1 10*3/uL (ref 1.4–7.0)
Neutrophils: 56 %
Platelets: 208 10*3/uL (ref 150–379)
RBC: 5.06 x10E6/uL (ref 4.14–5.80)
RDW: 13.8 % (ref 12.3–15.4)
WBC: 5.6 10*3/uL (ref 3.4–10.8)

## 2015-12-16 LAB — CMP14+EGFR
A/G RATIO: 1.5 (ref 1.1–2.5)
ALBUMIN: 4.6 g/dL (ref 3.5–5.5)
ALT: 20 IU/L (ref 0–44)
AST: 20 IU/L (ref 0–40)
Alkaline Phosphatase: 61 IU/L (ref 39–117)
BILIRUBIN TOTAL: 0.3 mg/dL (ref 0.0–1.2)
BUN / CREAT RATIO: 10 (ref 9–20)
BUN: 10 mg/dL (ref 6–24)
CALCIUM: 9.5 mg/dL (ref 8.7–10.2)
CHLORIDE: 100 mmol/L (ref 96–106)
CO2: 25 mmol/L (ref 18–29)
Creatinine, Ser: 0.97 mg/dL (ref 0.76–1.27)
GFR, EST AFRICAN AMERICAN: 98 mL/min/{1.73_m2} (ref >59)
GFR, EST NON AFRICAN AMERICAN: 85 mL/min/{1.73_m2} (ref >59)
GLUCOSE: 88 mg/dL (ref 65–99)
Globulin, Total: 3 g/dL (ref 1.5–4.5)
Potassium: 4.5 mmol/L (ref 3.5–5.2)
Sodium: 140 mmol/L (ref 134–144)
TOTAL PROTEIN: 7.6 g/dL (ref 6.0–8.5)

## 2015-12-16 LAB — TESTOSTERONE,FREE AND TOTAL
TESTOSTERONE FREE: 4.1 pg/mL — AB (ref 7.2–24.0)
Testosterone: 184 ng/dL — ABNORMAL LOW (ref 348–1197)

## 2016-01-12 ENCOUNTER — Other Ambulatory Visit: Payer: Self-pay | Admitting: Family Medicine

## 2016-01-20 ENCOUNTER — Ambulatory Visit (INDEPENDENT_AMBULATORY_CARE_PROVIDER_SITE_OTHER): Payer: Medicare Other | Admitting: Family Medicine

## 2016-01-20 ENCOUNTER — Encounter: Payer: Self-pay | Admitting: Family Medicine

## 2016-01-20 VITALS — BP 134/74 | HR 64 | Temp 98.5°F | Ht 70.0 in | Wt 218.0 lb

## 2016-01-20 DIAGNOSIS — M5442 Lumbago with sciatica, left side: Secondary | ICD-10-CM | POA: Diagnosis not present

## 2016-01-20 DIAGNOSIS — E291 Testicular hypofunction: Secondary | ICD-10-CM

## 2016-01-20 DIAGNOSIS — M5441 Lumbago with sciatica, right side: Secondary | ICD-10-CM

## 2016-01-20 DIAGNOSIS — M2550 Pain in unspecified joint: Secondary | ICD-10-CM | POA: Diagnosis not present

## 2016-01-20 DIAGNOSIS — M25569 Pain in unspecified knee: Secondary | ICD-10-CM | POA: Diagnosis not present

## 2016-01-20 DIAGNOSIS — I10 Essential (primary) hypertension: Secondary | ICD-10-CM

## 2016-01-20 NOTE — Progress Notes (Signed)
Subjective:  Patient ID: Austin Huber, male    DOB: 26-Feb-1956  Age: 60 y.o. MRN: [id number]  CC: Hypertension   HPI Jayjuan Rylee presents for BP doing better. Med not causing side effects.   Androgel working out okay. Well - covered by insurance.   Left knee pain is worsening. Feels different from the radicular back pain. Goes into medial leg. 8/10 most of the time. Worse with working on them. Bearable with nonweightbearing rest.   History Darnel has a past medical history of Barrett esophagus and GERD (gastroesophageal reflux disease).   He has past surgical history that includes Spine surgery and Ganglion cyst excision.   His family history includes Aneurysm in his father; Aortic aneurysm in his mother; Diabetes in his brother, brother, and sister; Heart disease in his brother and brother; Hypertension in his mother; Stroke in his father.He reports that he quit smoking about 2 years ago. He started smoking about 37 years ago. He does not have any smokeless tobacco history on file. He reports that he does not drink alcohol or use illicit drugs.    ROS Review of Systems  Constitutional: Negative for fever, chills, diaphoresis and unexpected weight change.  HENT: Negative for congestion, hearing loss, rhinorrhea and sore throat.   Eyes: Negative for visual disturbance.  Respiratory: Negative for cough and shortness of breath.   Cardiovascular: Negative for chest pain.  Gastrointestinal: Negative for abdominal pain, diarrhea and constipation.  Genitourinary: Negative for dysuria and flank pain.  Musculoskeletal: Positive for arthralgias (knees, lower back). Negative for joint swelling.  Skin: Negative for rash.  Neurological: Negative for dizziness and headaches.  Psychiatric/Behavioral: Negative for sleep disturbance and dysphoric mood.    Objective:  BP 134/74 mmHg  Pulse 64  Temp(Src) 98.5 F (36.9 C) (Oral)  Ht 5\' 10"  (1.778 m)  Wt 218 lb (98.884 kg)  BMI 31.28 kg/m2  SpO2  100%  BP Readings from Last 3 Encounters:  01/20/16 134/74  12/15/15 151/87  09/11/15 139/80    Wt Readings from Last 3 Encounters:  01/20/16 218 lb (98.884 kg)  12/15/15 221 lb (100.245 kg)  09/11/15 217 lb 9.6 oz (98.703 kg)     Physical Exam  Constitutional: He is oriented to person, place, and time. He appears well-developed and well-nourished. No distress.  HENT:  Head: Normocephalic and atraumatic.  Right Ear: External ear normal.  Left Ear: External ear normal.  Nose: Nose normal.  Mouth/Throat: Oropharynx is clear and moist.  Eyes: Conjunctivae and EOM are normal. Pupils are equal, round, and reactive to light.  Neck: Normal range of motion. Neck supple. No thyromegaly present.  Cardiovascular: Normal rate, regular rhythm and normal heart sounds.   No murmur heard. Pulmonary/Chest: Effort normal and breath sounds normal. No respiratory distress. He has no wheezes. He has no rales.  Abdominal: Soft. Bowel sounds are normal. He exhibits no distension. There is no tenderness.  Musculoskeletal:  The left  knee has full range of motion without discomfort actively and passively. Gait is normal without a limp. The joint lines are nontender. The patella is palpable without tenderness or edema.  Lachman / anterior drawer signs are negative for signs of instability and pain free. McMurray testing reveals no pop or excessive discomfort. Varus and valgus stree maneuvers do not cause ligamentous stretch or instability.   Lymphadenopathy:    He has no cervical adenopathy.  Neurological: He is alert and oriented to person, place, and time. He has normal reflexes.  Skin: Skin  is warm and dry.  Psychiatric: He has a normal mood and affect. His behavior is normal. Judgment and thought content normal.     Lab Results  Component Value Date   WBC 5.6 12/15/2015   HGB 14.5 05/05/2015   HCT 43.6 12/15/2015   PLT 208 12/15/2015   GLUCOSE 88 12/15/2015   ALT 20 12/15/2015   AST 20  12/15/2015   NA 140 12/15/2015   K 4.5 12/15/2015   CL 100 12/15/2015   CREATININE 0.97 12/15/2015   BUN 10 12/15/2015   CO2 25 12/15/2015   HGBA1C 5.7 12/15/2015    Patient was never admitted.  Assessment & Plan:   Coast was seen today for hypertension.  Diagnoses and all orders for this visit:  Arthralgia -     Ambulatory referral to Orthopedics  Essential hypertension  Hypogonadism in male  Knee joint pain, unspecified laterality  Bilateral low back pain with sciatica, sciatica laterality unspecified      I am having Mr. Hartsock maintain his triamcinolone cream, famotidine, esomeprazole, oxybutynin, DULoxetine, HYDROcodone-acetaminophen, ANDROGEL, BYSTOLIC, and valsartan.  No orders of the defined types were placed in this encounter.     Follow-up: Return in about 3 months (around 04/21/2016).  Claretta Fraise, M.D.

## 2016-01-23 ENCOUNTER — Ambulatory Visit (INDEPENDENT_AMBULATORY_CARE_PROVIDER_SITE_OTHER): Payer: Medicare Other | Admitting: Pediatrics

## 2016-01-23 VITALS — BP 126/79 | HR 68 | Temp 97.4°F | Ht 70.0 in | Wt 217.8 lb

## 2016-01-23 DIAGNOSIS — J029 Acute pharyngitis, unspecified: Secondary | ICD-10-CM | POA: Diagnosis not present

## 2016-01-23 LAB — POCT INFLUENZA A/B
INFLUENZA A, POC: NEGATIVE
Influenza B, POC: NEGATIVE

## 2016-01-23 NOTE — Progress Notes (Signed)
    Subjective:    Patient ID: Austin Huber, male    DOB: 1956-04-27, 60 y.o.   MRN: [id number]  CC: Sore Throat; Generalized Body Aches; and Hoarse   HPI: Austin Huber is a 60 y.o. male presenting for Sore Throat; Generalized Body Aches; and Hoarse  Started getting sick yesterday Had a cold last week, got better after a couple days Now has sore throat, body aches/chills Appetite at baseline No nause, vomiting, no belly pain     Relevant past medical, surgical, family and social history reviewed and updated as indicated. Interim medical history since our last visit reviewed. Allergies and medications reviewed and updated.    ROS: Per HPI unless specifically indicated above  History  Smoking status  . Former Smoker  . Start date: 05/04/1978  . Quit date: 11/03/2013  Smokeless tobacco  . Not on file    Past Medical History Patient Active Problem List   Diagnosis Date Noted  . Hypogonadism in male 12/15/2015  . Hypoglycemia 12/15/2015  . Bradycardia 12/15/2015  . Essential hypertension 09/11/2015  . Arthralgia 09/11/2015        Objective:    BP 126/79 mmHg  Pulse 68  Temp(Src) 97.4 F (36.3 C) (Oral)  Ht 5\' 10"  (1.778 m)  Wt 217 lb 12.8 oz (98.793 kg)  BMI 31.25 kg/m2  Wt Readings from Last 3 Encounters:  01/23/16 217 lb 12.8 oz (98.793 kg)  01/20/16 218 lb (98.884 kg)  12/15/15 221 lb (100.245 kg)     Gen: NAD, alert, cooperative with exam, NCAT EYES: EOMI, no scleral injection or icterus ENT:  TMs slightly injected b/l, clear effusion b/l, normal LR, OP with erythema, one white lesion L tonsil LYMPH: no cervical LAD CV: NRRR, normal S1/S2, no murmur, distal pulses 2+ b/l Resp: CTABL, no wheezes, normal WOB Abd: +BS, soft, NTND.  Ext: No edema, warm Neuro: Alert and oriented, strength equal b/l UE and LE, coordination grossly normal MSK: normal muscle bulk     Assessment & Plan:    Austin Huber was seen today for sore throat, generalized body aches and  hoarse, flu negative, likely due to acute URI. Discussed symptomatic care.  Diagnoses and all orders for this visit:  Sore throat -     POCT Influenza A/B   Follow up plan: As needed  Assunta Found, MD [facility] 01/23/2016, 8:06 AM

## 2016-01-26 DIAGNOSIS — Z79891 Long term (current) use of opiate analgesic: Secondary | ICD-10-CM | POA: Diagnosis not present

## 2016-01-26 DIAGNOSIS — M47816 Spondylosis without myelopathy or radiculopathy, lumbar region: Secondary | ICD-10-CM | POA: Diagnosis not present

## 2016-01-26 DIAGNOSIS — G8929 Other chronic pain: Secondary | ICD-10-CM | POA: Diagnosis not present

## 2016-01-26 DIAGNOSIS — M179 Osteoarthritis of knee, unspecified: Secondary | ICD-10-CM | POA: Diagnosis not present

## 2016-02-03 ENCOUNTER — Ambulatory Visit (INDEPENDENT_AMBULATORY_CARE_PROVIDER_SITE_OTHER): Payer: Medicare Other | Admitting: Family

## 2016-02-03 ENCOUNTER — Encounter: Payer: Self-pay | Admitting: Family

## 2016-02-03 VITALS — BP 130/71 | HR 60 | Temp 97.2°F | Ht 70.0 in | Wt 214.0 lb

## 2016-02-03 DIAGNOSIS — J069 Acute upper respiratory infection, unspecified: Secondary | ICD-10-CM

## 2016-02-03 DIAGNOSIS — J029 Acute pharyngitis, unspecified: Secondary | ICD-10-CM | POA: Diagnosis not present

## 2016-02-03 DIAGNOSIS — J309 Allergic rhinitis, unspecified: Secondary | ICD-10-CM

## 2016-02-03 LAB — RAPID STREP SCREEN (MED CTR MEBANE ONLY): STREP GP A AG, IA W/REFLEX: NEGATIVE

## 2016-02-03 LAB — CULTURE, GROUP A STREP

## 2016-02-03 MED ORDER — CETIRIZINE HCL 10 MG PO TABS: 10.0000 mg | ORAL_TABLET | Freq: Every day | ORAL | Status: DC

## 2016-02-03 MED ORDER — FLUTICASONE PROPIONATE 50 MCG/ACT NA SUSP: 2.0000 | Freq: Every day | NASAL | Status: DC

## 2016-02-03 MED ORDER — AZITHROMYCIN 250 MG PO TABS: ORAL_TABLET | ORAL | Status: DC

## 2016-02-03 NOTE — Progress Notes (Signed)
Subjective:    Patient ID: Austin Huber, male    DOB: 1956-03-07, 60 y.o.   MRN: [id number]  Sore Throat  This is a recurrent problem. The current episode started 1 to 4 weeks ago. The problem has been gradually worsening. Neither side of throat is experiencing more pain than the other. There has been no fever. The pain is moderate. Associated symptoms include coughing and a hoarse voice. Pertinent negatives include no congestion, ear pain, headaches, neck pain, stridor, swollen glands or trouble swallowing. He has had no exposure to strep or mono. He has tried oral narcotic analgesics for the symptoms.      Review of Systems  Constitutional: Negative for fever and chills.  HENT: Positive for hoarse voice, postnasal drip, sore throat and voice change. Negative for congestion, ear pain, mouth sores, sinus pressure, sneezing and trouble swallowing.   Eyes: Negative.   Respiratory: Positive for cough. Negative for wheezing and stridor.   Musculoskeletal: Negative for neck pain.  Skin: Negative for rash.  Allergic/Immunologic: Negative for environmental allergies and food allergies.  Neurological: Negative for headaches.       Objective:   Physical Exam  Constitutional: Vital signs are normal. He is cooperative.  HENT:  Head: Normocephalic.  Right Ear: Hearing normal. No drainage or swelling. Tympanic membrane is not erythematous. No middle ear effusion.  Left Ear: Hearing normal. No drainage or swelling. Tympanic membrane is erythematous.  No middle ear effusion.  Nose: Mucosal edema and rhinorrhea present.  Mouth/Throat: Uvula is midline. Oropharyngeal exudate and posterior oropharyngeal erythema present.  Eyes: Conjunctivae are normal. Pupils are equal, round, and reactive to light. Right eye exhibits no discharge. Left eye exhibits no discharge.  Neck: Normal range of motion. No thyromegaly present.  Cardiovascular: Normal rate, regular rhythm and normal heart sounds.   No murmur  heard. Pulmonary/Chest: Effort normal and breath sounds normal. No stridor. He has no wheezes. He has no rhonchi. He has no rales.  Lymphadenopathy:       Head (right side): No submental, no tonsillar, no preauricular and no posterior auricular adenopathy present.       Head (left side): No submental, no tonsillar, no preauricular and no posterior auricular adenopathy present.    He has no cervical adenopathy.  Neurological: He is alert.  Skin: Skin is warm and dry. No rash noted.  Vitals reviewed.    BP 130/71 mmHg  Pulse 60  Temp(Src) 97.2 F (36.2 C) (Oral)  Ht 5\' 10"  (1.778 m)  Wt 214 lb (97.07 kg)  BMI 30.71 kg/m2      Assessment & Plan:  1. Sore throat - Rapid strep screen (not at [facility])  2. Acute upper respiratory infection -- Take meds as prescribed - Use a cool mist humidifier  -Use saline nose sprays frequently -Saline irrigations of the nose can be very helpful if done frequently.  * 4X daily for 1 week*  * Use of a nettie pot can be helpful with this. Follow directions with this* -Force fluids -For any cough or congestion  Use plain Mucinex- regular strength or max strength is fine   * Children- consult with Pharmacist for dosing -For fever or aces or pains- take tylenol or ibuprofen appropriate for age and weight.  * for fevers greater than 101 orally you may alternate ibuprofen and tylenol every  3 hours. -Throat lozenges if help -New toothbrush in 3 days -Pt told not to pick up Zpak until Saturday if symptoms do not improve -  fluticasone (FLONASE) 50 MCG/ACT nasal spray; Place 2 sprays into both nostrils daily.  Dispense: 16 g; Refill: 6 - cetirizine (ZYRTEC) 10 MG tablet; Take 1 tablet (10 mg total) by mouth daily.  Dispense: 30 tablet; Refill: 11 - azithromycin (ZITHROMAX Z-PAK) 250 MG tablet; As directed  Dispense: 1 each; Refill: 0  3. Allergic rhinitis, unspecified allergic rhinitis type - fluticasone (FLONASE) 50 MCG/ACT nasal spray; Place 2 sprays  into both nostrils daily.  Dispense: 16 g; Refill: 6 - cetirizine (ZYRTEC) 10 MG tablet; Take 1 tablet (10 mg total) by mouth daily.  Dispense: 30 tablet; Refill: [place], FNP

## 2016-02-03 NOTE — Patient Instructions (Addendum)
Sore Throat A sore throat is a painful, burning, sore, or scratchy feeling of the throat. There may be pain or tenderness when swallowing or talking. You may have other symptoms with a sore throat. These include coughing, sneezing, fever, or a swollen neck. A sore throat is often the first sign of another sickness. These sicknesses may include a cold, flu, strep throat, or an infection called mono. Most sore throats go away without medical treatment.  HOME CARE   Only take medicine as told by your doctor.  Drink enough fluids to keep your pee (urine) clear or pale yellow.  Rest as needed.  Try using throat sprays, lozenges, or suck on hard candy (if older than 4 years or as told).  Sip warm liquids, such as broth, herbal tea, or warm water with honey. Try sucking on frozen ice pops or drinking cold liquids.  Rinse the mouth (gargle) with salt water. Mix 1 teaspoon salt with 8 ounces of water.  Do not smoke. Avoid being around others when they are smoking.  Put a humidifier in your bedroom at night to moisten the air. You can also turn on a hot shower and sit in the bathroom for 5-10 minutes. Be sure the bathroom door is closed. GET HELP RIGHT AWAY IF:   You have trouble breathing.  You cannot swallow fluids, soft foods, or your spit (saliva).  You have more puffiness (swelling) in the throat.  Your sore throat does not get better in 7 days.  You feel sick to your stomach (nauseous) and throw up (vomit).  You have a fever or lasting symptoms for more than 2-3 days.  You have a fever and your symptoms suddenly get worse. MAKE SURE YOU:   Understand these instructions.  Will watch your condition.  Will get help right away if you are not doing well or get worse.   This information is not intended to replace advice given to you by your health care provider. Make sure you discuss any questions you have with your health care provider.   Document Released: 08/16/2008 Document  Revised: 08/01/2012 Document Reviewed: 07/15/2012 Elsevier Interactive Patient Education 2016 [place] meds as prescribed - Use a cool mist humidifier  -Use saline nose sprays frequently -Saline irrigations of the nose can be very helpful if done frequently.  * 4X daily for 1 week*  * Use of a nettie pot can be helpful with this. Follow directions with this* -Force fluids -For any cough or congestion  Use plain Mucinex- regular strength or max strength is fine   * Children- consult with Pharmacist for dosing -For fever or aces or pains- take tylenol or ibuprofen appropriate for age and weight.  * for fevers greater than 101 orally you may alternate ibuprofen and tylenol every  3 hours. -Throat lozenges if help -New toothbrush in 3 days   Evelina Dun, FNP

## 2016-02-12 ENCOUNTER — Other Ambulatory Visit: Payer: Self-pay | Admitting: Family Medicine

## 2016-02-24 ENCOUNTER — Other Ambulatory Visit: Payer: Self-pay

## 2016-02-24 MED ORDER — FAMOTIDINE 20 MG PO TABS: 20.0000 mg | ORAL_TABLET | Freq: Two times a day (BID) | ORAL | Status: DC

## 2016-03-01 ENCOUNTER — Other Ambulatory Visit: Payer: Self-pay | Admitting: Family Medicine

## 2016-03-01 ENCOUNTER — Telehealth: Payer: Self-pay | Admitting: Family Medicine

## 2016-03-01 ENCOUNTER — Other Ambulatory Visit: Payer: Self-pay | Admitting: *Deleted

## 2016-03-01 MED ORDER — VALSARTAN 160 MG PO TABS: ORAL_TABLET | ORAL | Status: DC

## 2016-03-23 ENCOUNTER — Telehealth: Payer: Self-pay | Admitting: Family Medicine

## 2016-03-23 NOTE — Telephone Encounter (Signed)
FYI. Awaiting form

## 2016-03-23 NOTE — Telephone Encounter (Signed)
Okay to change. Give for 6 mos. Thanks ws

## 2016-03-24 ENCOUNTER — Telehealth: Payer: Self-pay | Admitting: Family Medicine

## 2016-03-24 NOTE — Telephone Encounter (Signed)
I have the form for Axiron but I need to know dosage and instructions to fill form out ?

## 2016-03-24 NOTE — Telephone Encounter (Signed)
Austin Huber has form

## 2016-03-25 MED ORDER — AXIRON 30 MG/ACT TD SOLN: TRANSDERMAL | Status: DC

## 2016-03-25 NOTE — Telephone Encounter (Signed)
Scrip written.  

## 2016-03-25 NOTE — Telephone Encounter (Signed)
RX for Axiron sent into [facility] per pt request Okayed per Dr Livia Snellen

## 2016-03-25 NOTE — Telephone Encounter (Signed)
Please advise dosage of Axiron

## 2016-03-28 ENCOUNTER — Telehealth: Payer: Self-pay | Admitting: Family Medicine

## 2016-03-28 DIAGNOSIS — M47816 Spondylosis without myelopathy or radiculopathy, lumbar region: Secondary | ICD-10-CM | POA: Diagnosis not present

## 2016-03-28 DIAGNOSIS — G8929 Other chronic pain: Secondary | ICD-10-CM | POA: Diagnosis not present

## 2016-03-28 DIAGNOSIS — M961 Postlaminectomy syndrome, not elsewhere classified: Secondary | ICD-10-CM | POA: Diagnosis not present

## 2016-03-28 DIAGNOSIS — Z79891 Long term (current) use of opiate analgesic: Secondary | ICD-10-CM | POA: Diagnosis not present

## 2016-03-28 DIAGNOSIS — M179 Osteoarthritis of knee, unspecified: Secondary | ICD-10-CM | POA: Diagnosis not present

## 2016-03-29 ENCOUNTER — Other Ambulatory Visit: Payer: Self-pay | Admitting: Family Medicine

## 2016-03-30 MED ORDER — TESTOSTERONE 4 MG/24HR TD PT24: 1.0000 | MEDICATED_PATCH | Freq: Every day | TRANSDERMAL | Status: DC

## 2016-03-30 NOTE — Telephone Encounter (Signed)
The requested med has written and printed. Please have patient drop by for it, or you can call it to the pharmacy.  Please let the patient know. Thanks, WS

## 2016-03-30 NOTE — Telephone Encounter (Signed)
Patient will come by and pick up Rx.

## 2016-03-31 ENCOUNTER — Telehealth: Payer: Self-pay

## 2016-03-31 NOTE — Telephone Encounter (Signed)
Insurance denied prior authorization for Axiron   Must try Androderm first

## 2016-04-01 NOTE — Telephone Encounter (Signed)
I wrote for androderm on Wednesday. It's in his med list. Please let him know the scrip is ready.

## 2016-04-02 ENCOUNTER — Other Ambulatory Visit: Payer: Self-pay | Admitting: Family Medicine

## 2016-04-11 ENCOUNTER — Encounter: Payer: Self-pay | Admitting: Family Medicine

## 2016-04-11 ENCOUNTER — Ambulatory Visit (INDEPENDENT_AMBULATORY_CARE_PROVIDER_SITE_OTHER): Payer: Medicare Other | Admitting: Family Medicine

## 2016-04-11 VITALS — BP 123/72 | HR 64 | Temp 98.1°F | Ht 70.0 in | Wt 214.0 lb

## 2016-04-11 DIAGNOSIS — L729 Follicular cyst of the skin and subcutaneous tissue, unspecified: Secondary | ICD-10-CM | POA: Diagnosis not present

## 2016-04-11 DIAGNOSIS — L720 Epidermal cyst: Secondary | ICD-10-CM | POA: Diagnosis not present

## 2016-04-11 NOTE — Progress Notes (Signed)
   Subjective:  Patient ID: Austin Huber, male    DOB: [birth year]  Age: 60 y.o. MRN: [id number]  CC: cyst excision   HPI Austin Huber presents for Sebaceous cyst removal from forehead. Recently noted lump betweeneye brows. Wants removed.   History Austin Huber has a past medical history of Barrett esophagus and GERD (gastroesophageal reflux disease).   He has past surgical history that includes Spine surgery and Ganglion cyst excision.   His family history includes Aneurysm in his father; Aortic aneurysm in his mother; Diabetes in his brother, brother, and sister; Heart disease in his brother and brother; Hypertension in his mother; Stroke in his father.He reports that he quit smoking about 2 years ago. He started smoking about 37 years ago. He does not have any smokeless tobacco history on file. He reports that he does not drink alcohol or use illicit drugs.    ROS Review of Systems  Constitutional: Negative for fever.  Skin: Negative for color change, pallor, rash and wound.     Objective:  BP 123/72 mmHg  Pulse 64  Temp(Src) 98.1 F (36.7 C) (Oral)  Ht 5\' 10"  (1.778 m)  Wt 214 lb (97.07 kg)  BMI 30.71 kg/m2  BP Readings from Last 3 Encounters:  04/11/16 123/72  02/03/16 130/71  01/23/16 126/79    Wt Readings from Last 3 Encounters:  04/11/16 214 lb (97.07 kg)  02/03/16 214 lb (97.07 kg)  01/23/16 217 lb 12.8 oz (98.793 kg)     Physical Exam  Constitutional: He is oriented to person, place, and time. He appears well-developed and well-nourished. No distress.  Neurological: He is alert and oriented to person, place, and time.  Skin: Skin is warm and dry.  7 mm sq nodule noted just to left of midline at bridge of nose/ level of brow. Not fluctuent. Somewhat rubbery. Freely mobile. No erythema, nontender    sebaceous cyst The pt was prepped with betadine and draped in sterile fashion. Local anesthesia obtained using 4 cc 2% lidocaine with epi. A 15 blade scalpel was used to  create a linear incision at the lesion following the skin lines. The lesion was then shelled from its bed using scissor technique. The  Specimen was placed in fixative media for lab transport. The lesion was then closed using  4.0 nylon in a running fashion for skin closure.  After cleaning, the wound was dressed using neosporin and gauze.   Wound care reviewed. Signs and symptoms of infection also reviewed with pt / significant other.   Assessment & Plan:   [name] was seen today for cyst excision.  Diagnoses and all orders for this visit:  Cyst of skin -     Pathology      I have discontinued Mr. Graveline azithromycin. I am also having him maintain his triamcinolone cream, esomeprazole, oxybutynin, HYDROcodone-acetaminophen, BYSTOLIC, fluticasone, cetirizine, valsartan, oxybutynin, famotidine, testosterone, and DULoxetine.  No orders of the defined types were placed in this encounter.     Follow-up: Return in about 4 days (around 04/15/2016).  Claretta Fraise, M.D.

## 2016-04-13 LAB — PATHOLOGY

## 2016-04-15 ENCOUNTER — Encounter: Payer: Self-pay | Admitting: Family Medicine

## 2016-04-15 ENCOUNTER — Ambulatory Visit (INDEPENDENT_AMBULATORY_CARE_PROVIDER_SITE_OTHER): Payer: Medicare Other | Admitting: Family Medicine

## 2016-04-15 VITALS — BP 125/77 | HR 53 | Temp 98.1°F | Ht 70.0 in | Wt 219.0 lb

## 2016-04-15 DIAGNOSIS — L723 Sebaceous cyst: Secondary | ICD-10-CM

## 2016-04-15 NOTE — Progress Notes (Signed)
Patient presents for suture removal. The wound at the bridge if the nose  is well healed without signs of infection.  The sutures are removed. Steristrip applied. Wound care and activity instructions given. Return prn.  Claretta Fraise, MD

## 2016-04-19 ENCOUNTER — Other Ambulatory Visit: Payer: Medicare Other

## 2016-04-19 DIAGNOSIS — Z7689 Persons encountering health services in other specified circumstances: Secondary | ICD-10-CM | POA: Diagnosis not present

## 2016-04-19 DIAGNOSIS — Z136 Encounter for screening for cardiovascular disorders: Secondary | ICD-10-CM | POA: Diagnosis not present

## 2016-04-19 DIAGNOSIS — I10 Essential (primary) hypertension: Secondary | ICD-10-CM

## 2016-04-19 LAB — CBC WITH DIFFERENTIAL/PLATELET
BASOS: 1 %
Basophils Absolute: 0 10*3/uL (ref 0.0–0.2)
EOS (ABSOLUTE): 0.2 10*3/uL (ref 0.0–0.4)
Eos: 3 %
Hematocrit: 45.2 % (ref 37.5–51.0)
Hemoglobin: 15.6 g/dL (ref 12.6–17.7)
IMMATURE GRANS (ABS): 0 10*3/uL (ref 0.0–0.1)
Immature Granulocytes: 0 %
LYMPHS ABS: 1.9 10*3/uL (ref 0.7–3.1)
Lymphs: 28 %
MCH: 29.8 pg (ref 26.6–33.0)
MCHC: 34.5 g/dL (ref 31.5–35.7)
MCV: 86 fL (ref 79–97)
MONOS ABS: 0.5 10*3/uL (ref 0.1–0.9)
Monocytes: 7 %
NEUTROS ABS: 4.2 10*3/uL (ref 1.4–7.0)
Neutrophils: 61 %
PLATELETS: 192 10*3/uL (ref 150–379)
RBC: 5.24 x10E6/uL (ref 4.14–5.80)
RDW: 14.7 % (ref 12.3–15.4)
WBC: 6.9 10*3/uL (ref 3.4–10.8)

## 2016-04-19 LAB — CMP14+EGFR
A/G RATIO: 1.6 (ref 1.2–2.2)
ALT: 24 IU/L (ref 0–44)
AST: 23 IU/L (ref 0–40)
Albumin: 4.6 g/dL (ref 3.6–4.8)
Alkaline Phosphatase: 58 IU/L (ref 39–117)
BILIRUBIN TOTAL: 0.4 mg/dL (ref 0.0–1.2)
BUN/Creatinine Ratio: 16 (ref 10–24)
BUN: 16 mg/dL (ref 8–27)
CHLORIDE: 101 mmol/L (ref 96–106)
CO2: 24 mmol/L (ref 18–29)
Calcium: 9.9 mg/dL (ref 8.6–10.2)
Creatinine, Ser: 0.98 mg/dL (ref 0.76–1.27)
GFR calc non Af Amer: 83 mL/min/{1.73_m2} (ref >59)
GFR, EST AFRICAN AMERICAN: 96 mL/min/{1.73_m2} (ref >59)
GLUCOSE: 92 mg/dL (ref 65–99)
Globulin, Total: 2.9 g/dL (ref 1.5–4.5)
POTASSIUM: 5.5 mmol/L — AB (ref 3.5–5.2)
Sodium: 142 mmol/L (ref 134–144)
TOTAL PROTEIN: 7.5 g/dL (ref 6.0–8.5)

## 2016-04-22 ENCOUNTER — Encounter: Payer: Self-pay | Admitting: Family Medicine

## 2016-04-22 ENCOUNTER — Ambulatory Visit (INDEPENDENT_AMBULATORY_CARE_PROVIDER_SITE_OTHER): Payer: Medicare Other | Admitting: Family Medicine

## 2016-04-22 ENCOUNTER — Encounter: Payer: Medicare Other | Admitting: Family Medicine

## 2016-04-22 VITALS — BP 117/75 | HR 68 | Temp 97.3°F | Ht 70.0 in | Wt 215.6 lb

## 2016-04-22 DIAGNOSIS — E876 Hypokalemia: Secondary | ICD-10-CM | POA: Diagnosis not present

## 2016-04-22 DIAGNOSIS — Z0000 Encounter for general adult medical examination without abnormal findings: Secondary | ICD-10-CM | POA: Diagnosis not present

## 2016-04-22 DIAGNOSIS — E349 Endocrine disorder, unspecified: Secondary | ICD-10-CM

## 2016-04-22 DIAGNOSIS — K2100 Gastro-esophageal reflux disease with esophagitis, without bleeding: Secondary | ICD-10-CM

## 2016-04-22 DIAGNOSIS — E291 Testicular hypofunction: Secondary | ICD-10-CM | POA: Diagnosis not present

## 2016-04-22 DIAGNOSIS — IMO0001 Reserved for inherently not codable concepts without codable children: Secondary | ICD-10-CM

## 2016-04-22 DIAGNOSIS — K219 Gastro-esophageal reflux disease without esophagitis: Secondary | ICD-10-CM | POA: Insufficient documentation

## 2016-04-22 NOTE — Progress Notes (Signed)
Subjective:  Patient ID: Austin Huber, male    DOB: [birth year]  Age: 60 y.o. MRN: [id number]  CC: Annual Exam   HPI Austin Huber presents for Annual examination. Doing well with medications with the exception of cymbalta upset stomach when taking 60 mg qd. Tolerating 30 BID works well. He uses this both for mood and for pain relief along with his opiates. These are prescribed by pain clinic.   follow-up of hypertension. Patient has no history of headache chest pain or shortness of breath or recent cough. Patient also denies symptoms of TIA such as numbness weakness lateralizing. Patient checks  blood pressure at home and has not had any elevated readings recently. Patient denies side effects from his medication. States taking it regularly.   Patient in for follow-up of GERD. Currently asymptomatic taking  PPI daily. There is no chest pain or heartburn. No hematemesis and no melena. No dysphagia or choking. Onset is remote. Progression is stable. Complicating factors, none.   Patient having better bladder control with the Ditropan.  Patient prefers the Androderm patch. That's working quite well. His overall feeling of well-being is good and energy is good.   History Austin Huber has a past medical history of Barrett esophagus and GERD (gastroesophageal reflux disease).   He has past surgical history that includes Spine surgery and Ganglion cyst excision.   His family history includes Aneurysm in his father; Aortic aneurysm in his mother; Diabetes in his brother, brother, and sister; Heart disease in his brother and brother; Hypertension in his mother; Stroke in his father.He reports that he quit smoking about 2 years ago. He started smoking about 37 years ago. He does not have any smokeless tobacco history on file. He reports that he does not drink alcohol or use illicit drugs.    ROS Review of Systems  Constitutional: Negative for fever, chills, diaphoresis, activity change, appetite change,  fatigue and unexpected weight change.  HENT: Negative for congestion, ear pain, hearing loss, postnasal drip, rhinorrhea, sore throat, tinnitus and trouble swallowing.   Eyes: Negative for photophobia, pain, discharge and redness.  Respiratory: Negative for apnea, cough, choking, chest tightness, shortness of breath, wheezing and stridor.   Cardiovascular: Negative for chest pain, palpitations and leg swelling.  Gastrointestinal: Negative for nausea, vomiting, abdominal pain, diarrhea, constipation, blood in stool and abdominal distention.  Endocrine: Negative for cold intolerance, heat intolerance, polydipsia, polyphagia and polyuria.  Genitourinary: Negative for dysuria, urgency, frequency, hematuria, flank pain, enuresis, difficulty urinating and genital sores.  Musculoskeletal: Negative for joint swelling and arthralgias.  Skin: Negative for color change, rash and wound.  Allergic/Immunologic: Negative for immunocompromised state.  Neurological: Negative for dizziness, tremors, seizures, syncope, facial asymmetry, speech difficulty, weakness, light-headedness, numbness and headaches.  Hematological: Does not bruise/bleed easily.  Psychiatric/Behavioral: Negative for suicidal ideas, hallucinations, behavioral problems, confusion, sleep disturbance, dysphoric mood, decreased concentration and agitation. The patient is not nervous/anxious and is not hyperactive.     Objective:  BP 117/75 mmHg  Pulse 68  Temp(Src) 97.3 F (36.3 C) (Oral)  Ht 5' 10"  (1.778 m)  Wt 215 lb 9.6 oz (97.796 kg)  BMI 30.94 kg/m2  SpO2 97%  BP Readings from Last 3 Encounters:  04/22/16 117/75  04/15/16 125/77  04/11/16 123/72    Wt Readings from Last 3 Encounters:  04/22/16 215 lb 9.6 oz (97.796 kg)  04/15/16 219 lb (99.338 kg)  04/11/16 214 lb (97.07 kg)     Physical Exam  Constitutional: He is oriented to person,  place, and time. He appears well-developed and well-nourished.  HENT:  Head:  Normocephalic and atraumatic.  Mouth/Throat: Oropharynx is clear and moist.  Eyes: EOM are normal. Pupils are equal, round, and reactive to light.  Neck: Normal range of motion. No tracheal deviation present. No thyromegaly present.  Cardiovascular: Normal rate, regular rhythm and normal heart sounds.  Exam reveals no gallop and no friction rub.   No murmur heard. Pulmonary/Chest: Breath sounds normal. He has no wheezes. He has no rales.  Abdominal: Soft. He exhibits no mass. There is no tenderness.  Musculoskeletal: Normal range of motion. He exhibits tenderness (lower spine midline). He exhibits no edema.  Neurological: He is alert and oriented to person, place, and time.  Skin: Skin is warm and dry. No rash noted. No erythema. No pallor.  Psychiatric: He has a normal mood and affect. His behavior is normal. Judgment and thought content normal.     Lab Results  Component Value Date   WBC 6.9 04/19/2016   HGB 14.5 05/05/2015   HCT 45.2 04/19/2016   PLT 192 04/19/2016   GLUCOSE 92 04/19/2016   CHOL WILL FOLLOW 04/19/2016   TRIG WILL FOLLOW 04/19/2016   HDL WILL FOLLOW 04/19/2016   LDLCALC WILL FOLLOW 04/19/2016   ALT 24 04/19/2016   AST 23 04/19/2016   NA 142 04/19/2016   K 5.5* 04/19/2016   CL 101 04/19/2016   CREATININE 0.98 04/19/2016   BUN 16 04/19/2016   CO2 24 04/19/2016   HGBA1C 5.7 12/15/2015    Patient was never admitted.  Assessment & Plan:   Austin Huber was seen today for annual exam.  Diagnoses and all orders for this visit:  Gastroesophageal reflux disease with esophagitis  Hypokalemia -     BMP8+EGFR  Testosterone deficiency -     Testosterone,Free and Total  Well adult    Labs reviewed showing that the testosterone was not performed tablet daily added today. Lipids are pending as well. Otherwise labs showed that potassium was actually borderline elevated and will be repeated today.  I am having Austin Huber maintain his triamcinolone cream, esomeprazole,  oxybutynin, HYDROcodone-acetaminophen, valsartan, famotidine, testosterone, and DULoxetine.  No orders of the defined types were placed in this encounter.     Follow-up: Return in about 6 months (around 10/22/2016) for Hypogonadism, reflux.  Claretta Fraise, M.D.

## 2016-04-23 LAB — LIPID PANEL
CHOLESTEROL TOTAL: 218 mg/dL — AB (ref 100–199)
Chol/HDL Ratio: 4.2 ratio units (ref 0.0–5.0)
HDL: 52 mg/dL (ref >39)
LDL Calculated: 144 mg/dL — ABNORMAL HIGH (ref 0–99)
Triglycerides: 112 mg/dL (ref 0–149)
VLDL Cholesterol Cal: 22 mg/dL (ref 5–40)

## 2016-04-23 LAB — SPECIMEN STATUS REPORT

## 2016-04-24 LAB — BMP8+EGFR
BUN/Creatinine Ratio: 16 (ref 10–24)
BUN: 16 mg/dL (ref 8–27)
CALCIUM: 9.4 mg/dL (ref 8.6–10.2)
CHLORIDE: 100 mmol/L (ref 96–106)
CO2: 21 mmol/L (ref 18–29)
CREATININE: 0.99 mg/dL (ref 0.76–1.27)
GFR calc Af Amer: 95 mL/min/{1.73_m2} (ref >59)
GFR calc non Af Amer: 82 mL/min/{1.73_m2} (ref >59)
GLUCOSE: 95 mg/dL (ref 65–99)
Potassium: 4.7 mmol/L (ref 3.5–5.2)
Sodium: 140 mmol/L (ref 134–144)

## 2016-04-24 LAB — TESTOSTERONE,FREE AND TOTAL
TESTOSTERONE FREE: 7.9 pg/mL (ref 6.6–18.1)
TESTOSTERONE: 444 ng/dL (ref 348–1197)

## 2016-04-25 ENCOUNTER — Telehealth: Payer: Self-pay | Admitting: *Deleted

## 2016-04-25 MED ORDER — ATORVASTATIN CALCIUM 40 MG PO TABS: 40.0000 mg | ORAL_TABLET | Freq: Every day | ORAL | Status: DC

## 2016-04-25 NOTE — Telephone Encounter (Signed)
-----   Message from Claretta Fraise, MD sent at 04/25/2016 11:50 AM EDT ----- Your cholesterol is too high. It would benefit from medication. This would reduce your risk for heart disease and stroke. I recommend that you take atorvastatin.  Nurse: please send in a prescription to pt.s pharmacy, if patient is willing, for atorvastatin 40 mg. One daily.  6 mos supply.  Make sure they are set up for a followup in 6 mos. Ask them to come 2 days early for fasting NMR and CMP.

## 2016-04-25 NOTE — Telephone Encounter (Signed)
Patient aware of lab results and will call back to make an appt.  Medication sent to pharmacy

## 2016-05-09 ENCOUNTER — Ambulatory Visit: Payer: Medicare Other | Admitting: Family Medicine

## 2016-05-11 ENCOUNTER — Ambulatory Visit: Payer: Medicare Other | Admitting: Family Medicine

## 2016-05-23 ENCOUNTER — Telehealth: Payer: Self-pay | Admitting: Family Medicine

## 2016-05-23 ENCOUNTER — Other Ambulatory Visit: Payer: Self-pay | Admitting: *Deleted

## 2016-05-23 MED ORDER — TESTOSTERONE 30 MG/ACT TD SOLN: 60.0000 mg | Freq: Every morning | TRANSDERMAL | Status: DC

## 2016-05-23 NOTE — Telephone Encounter (Signed)
The dose of Axiron his 60 mg every a.m. to start

## 2016-05-23 NOTE — Telephone Encounter (Signed)
Okay with me to try the  testosterone replacement theapy

## 2016-05-23 NOTE — Telephone Encounter (Signed)
Can you please let us know dosage please

## 2016-05-23 NOTE — Telephone Encounter (Signed)
Cover PCP, please advise and send back to the pools.

## 2016-05-25 DIAGNOSIS — F329 Major depressive disorder, single episode, unspecified: Secondary | ICD-10-CM | POA: Diagnosis not present

## 2016-05-25 DIAGNOSIS — G8929 Other chronic pain: Secondary | ICD-10-CM | POA: Diagnosis not present

## 2016-05-25 DIAGNOSIS — M47816 Spondylosis without myelopathy or radiculopathy, lumbar region: Secondary | ICD-10-CM | POA: Diagnosis not present

## 2016-05-25 DIAGNOSIS — M179 Osteoarthritis of knee, unspecified: Secondary | ICD-10-CM | POA: Diagnosis not present

## 2016-05-25 DIAGNOSIS — Z79891 Long term (current) use of opiate analgesic: Secondary | ICD-10-CM | POA: Diagnosis not present

## 2016-05-25 NOTE — Telephone Encounter (Signed)
Pt notified RX is ready for pick up RX to front  

## 2016-05-31 ENCOUNTER — Telehealth: Payer: Self-pay

## 2016-05-31 NOTE — Telephone Encounter (Signed)
Insurance prior authorized Axiron through 11/20/16

## 2016-06-29 ENCOUNTER — Encounter: Payer: Self-pay | Admitting: Family Medicine

## 2016-06-29 ENCOUNTER — Ambulatory Visit (INDEPENDENT_AMBULATORY_CARE_PROVIDER_SITE_OTHER): Payer: Medicare Other | Admitting: Family Medicine

## 2016-06-29 VITALS — BP 132/83 | HR 56 | Temp 99.2°F | Ht 70.0 in | Wt 210.6 lb

## 2016-06-29 DIAGNOSIS — S161XXA Strain of muscle, fascia and tendon at neck level, initial encounter: Secondary | ICD-10-CM | POA: Diagnosis not present

## 2016-06-29 MED ORDER — CYCLOBENZAPRINE HCL 10 MG PO TABS: 10.0000 mg | ORAL_TABLET | Freq: Three times a day (TID) | ORAL | 1 refills | Status: DC | PRN

## 2016-06-29 NOTE — Progress Notes (Signed)
BP 132/83 (BP Location: Left Arm, Patient Position: Sitting, Cuff Size: Large)   Pulse (!) 56   Temp 99.2 F (37.3 C) (Oral)   Ht 5\' 10"  (1.778 m)   Wt 210 lb 9.6 oz (95.5 kg)   BMI 30.22 kg/m    Subjective:    Patient ID: Austin Huber, male    DOB: 26-Nov-1955, 60 y.o.   MRN: [id number]  HPI: Austin Huber is a 60 y.o. male presenting on 06/29/2016 for Neck Pain (right side of neck, began 3 weeks ago, has taken several OTC medications and used heat & ice, pain has not gone away; no known injury)   HPI Right-sided neck pain Patient had right-sided neck pain that has been going on for about 3 weeks but is worsened over the past week. He does admit that he was on vacation for 2 of those weeks and in a different bed with a different pill that he is used to. He has used some over-the-counter Tylenol and some ice and heat but pain has not gone away. He denies any fevers or chills or overlying skin changes. He denies any numbness or weakness going down either arm. He denies any pain in the midline and in the back of his neck.  Relevant past medical, surgical, family and social history reviewed and updated as indicated. Interim medical history since our last visit reviewed. Allergies and medications reviewed and updated.  Review of Systems  Constitutional: Negative for fever.  HENT: Negative for ear discharge and ear pain.   Eyes: Negative for discharge and visual disturbance.  Respiratory: Negative for shortness of breath and wheezing.   Cardiovascular: Negative for chest pain and leg swelling.  Gastrointestinal: Negative for abdominal pain, constipation and diarrhea.  Genitourinary: Negative for difficulty urinating.  Musculoskeletal: Positive for neck pain and neck stiffness. Negative for back pain and gait problem.  Skin: Negative for rash.  Neurological: Negative for syncope, light-headedness and headaches.  All other systems reviewed and are negative.   Per HPI unless specifically  indicated above     Medication List       Accurate as of 06/29/16 11:54 AM. Always use your most recent med list.          atorvastatin 40 MG tablet Commonly known as:  LIPITOR Take 1 tablet (40 mg total) by mouth daily.   cyclobenzaprine 10 MG tablet Commonly known as:  FLEXERIL Take 1 tablet (10 mg total) by mouth 3 (three) times daily as needed for muscle spasms.   DULoxetine 30 MG capsule Commonly known as:  CYMBALTA TAKE ONE CAPSULE BY MOUTH TWICE A DAY   esomeprazole 40 MG capsule Commonly known as:  NEXIUM Take 1 capsule (40 mg total) by mouth daily at 12 noon.   famotidine 20 MG tablet Commonly known as:  PEPCID TAKE 1 TABLET BY MOUTH TWICE A DAY   HYDROcodone-acetaminophen 10-325 MG tablet Commonly known as:  NORCO Take 1 tablet by mouth 2 (two) times daily.   oxybutynin 5 MG tablet Commonly known as:  DITROPAN Take 1 tablet (5 mg total) by mouth 2 (two) times daily.   Testosterone 30 MG/ACT Soln Commonly known as:  AXIRON Place 60 mg onto the skin every morning.   triamcinolone cream 0.1 % Commonly known as:  KENALOG Apply 1 application topically 2 (two) times daily.   valsartan 160 MG tablet Commonly known as:  DIOVAN TAKE 1 TABLET (160 MG TOTAL) BY MOUTH DAILY.  Objective:    BP 132/83 (BP Location: Left Arm, Patient Position: Sitting, Cuff Size: Large)   Pulse (!) 56   Temp 99.2 F (37.3 C) (Oral)   Ht 5\' 10"  (1.778 m)   Wt 210 lb 9.6 oz (95.5 kg)   BMI 30.22 kg/m   Wt Readings from Last 3 Encounters:  06/29/16 210 lb 9.6 oz (95.5 kg)  04/22/16 215 lb 9.6 oz (97.8 kg)  04/15/16 219 lb (99.3 kg)    Physical Exam  Constitutional: He is oriented to person, place, and time. He appears well-developed and well-nourished. No distress.  Eyes: Conjunctivae and EOM are normal. Pupils are equal, round, and reactive to light. Right eye exhibits no discharge. No scleral icterus.  Neck: Neck supple. No thyromegaly present.    Cardiovascular: Normal rate, regular rhythm, normal heart sounds and intact distal pulses.   No murmur heard. Pulmonary/Chest: Effort normal and breath sounds normal. No respiratory distress. He has no wheezes.  Musculoskeletal: Normal range of motion. He exhibits no edema.       Cervical back: He exhibits tenderness. He exhibits normal range of motion, no deformity and no laceration.       Back:  Lymphadenopathy:    He has no cervical adenopathy.  Neurological: He is alert and oriented to person, place, and time. Coordination normal.  Skin: Skin is warm and dry. No rash noted. He is not diaphoretic.  Psychiatric: He has a normal mood and affect. His behavior is normal.  Nursing note and vitals reviewed.     Assessment & Plan:   Problem List Items Addressed This Visit    None    Visit Diagnoses    Neck muscle strain, initial encounter    -  Primary   Patient already has meloxicam and recommended for him to do Flexeril as well and stretching and heat   Relevant Medications   cyclobenzaprine (FLEXERIL) 10 MG tablet       Follow up plan: Return if symptoms worsen or fail to improve.  Counseling provided for all of the vaccine components No orders of the defined types were placed in this encounter.   Caryl Pina, MD [facility] 06/29/2016, 11:54 AM

## 2016-07-22 ENCOUNTER — Other Ambulatory Visit: Payer: Self-pay | Admitting: Family Medicine

## 2016-08-09 ENCOUNTER — Encounter: Payer: Self-pay | Admitting: Family Medicine

## 2016-08-09 ENCOUNTER — Ambulatory Visit (INDEPENDENT_AMBULATORY_CARE_PROVIDER_SITE_OTHER): Payer: Medicare Other | Admitting: Family Medicine

## 2016-08-09 ENCOUNTER — Ambulatory Visit (INDEPENDENT_AMBULATORY_CARE_PROVIDER_SITE_OTHER): Payer: Medicare Other

## 2016-08-09 VITALS — BP 117/68 | HR 58 | Temp 98.2°F | Ht 70.0 in | Wt 211.0 lb

## 2016-08-09 DIAGNOSIS — Z23 Encounter for immunization: Secondary | ICD-10-CM

## 2016-08-09 DIAGNOSIS — M25511 Pain in right shoulder: Secondary | ICD-10-CM | POA: Diagnosis not present

## 2016-08-09 MED ORDER — PREDNISONE 10 MG PO TABS: ORAL_TABLET | ORAL | 0 refills | Status: DC

## 2016-08-09 NOTE — Progress Notes (Signed)
Subjective:  Patient ID: Austin Huber, male    DOB: 01/01/1956  Age: 60 y.o. MRN: [id number]  CC: Shoulder Pain (pt here today c/o right shoulder pain)   HPI Austin Huber presents for 9-10/10 pain located in the anteromedial portion of the shoulder. It radiates out to the acromion. It has inhibited motion for all parameters including flexion and rotation extension abduction. He has had no known injury. However he notes there is a bulge at the proximal shoulder.   History Austin Huber has a past medical history of Barrett esophagus and GERD (gastroesophageal reflux disease).   He has a past surgical history that includes Spine surgery and Ganglion cyst excision.   His family history includes Aneurysm in his father; Aortic aneurysm in his mother; Diabetes in his brother, brother, and sister; Heart disease in his brother and brother; Hypertension in his mother; Stroke in his father.He reports that he quit smoking about 2 years ago. He started smoking about 38 years ago. He has never used smokeless tobacco. He reports that he does not drink alcohol or use drugs.    ROS Review of Systems  Constitutional: Negative for chills, diaphoresis and fever.  HENT: Negative for sore throat.   Cardiovascular: Negative for chest pain.  Gastrointestinal: Negative for abdominal pain.  Musculoskeletal: Positive for arthralgias and myalgias. Negative for back pain, gait problem and neck pain.  Skin: Negative for rash.  Neurological: Negative for weakness and numbness.    Objective:  BP 117/68   Pulse (!) 58   Temp 98.2 F (36.8 C) (Oral)   Ht 5\' 10"  (1.778 m)   Wt 211 lb (95.7 kg)   BMI 30.28 kg/m   BP Readings from Last 3 Encounters:  08/09/16 117/68  06/29/16 132/83  04/22/16 117/75    Wt Readings from Last 3 Encounters:  08/09/16 211 lb (95.7 kg)  06/29/16 210 lb 9.6 oz (95.5 kg)  04/22/16 215 lb 9.6 oz (97.8 kg)     Physical Exam  Constitutional: He is oriented to person, place, and time. He  appears well-developed and well-nourished. He appears distressed.  HENT:  Head: Normocephalic.  Eyes: Pupils are equal, round, and reactive to light.  Neck: Normal range of motion.  Cardiovascular: Normal rate, regular rhythm and normal heart sounds.   No murmur heard. Pulmonary/Chest: Effort normal and breath sounds normal.  Abdominal: There is no tenderness.  Musculoskeletal: He exhibits tenderness.  There is moderate tenderness throughout the palpation of the clavicle from the before meals joint to the sternoclavicular joint. There is some bulging of the clavicle forward. This is exquisitely tender to palpation and percussion.  Neurological: He is alert and oriented to person, place, and time. He has normal reflexes.  Skin: Skin is warm and dry.  Psychiatric: His behavior is normal. Thought content normal.  Vitals reviewed.    Lab Results  Component Value Date   WBC 6.9 04/19/2016   HGB 14.5 05/05/2015   HCT 45.2 04/19/2016   PLT 192 04/19/2016   GLUCOSE 95 04/22/2016   CHOL 218 (H) 04/19/2016   TRIG 112 04/19/2016   HDL 52 04/19/2016   LDLCALC 144 (H) 04/19/2016   ALT 24 04/19/2016   AST 23 04/19/2016   NA 140 04/22/2016   K 4.7 04/22/2016   CL 100 04/22/2016   CREATININE 0.99 04/22/2016   BUN 16 04/22/2016   CO2 21 04/22/2016   HGBA1C 5.7 12/15/2015    Patient was never admitted.  Assessment & Plan:   Austin Huber  was seen today for shoulder pain.  Diagnoses and all orders for this visit:  Pain in joint of right shoulder -     DG Shoulder Right; Future -     DG Sternum; Future -     Cancel: DG Clavicle Right; Future  Encounter for immunization -     Flu Vaccine QUAD 36+ mos IM  Other orders -     predniSONE (DELTASONE) 10 MG tablet; Take 5 daily for 3 days followed by 4,3,2 and 1 for 3 days each.    X-ray shows no fracture of the sternum or the clavicle. No dislocation.    Follow-up: Return if symptoms worsen or fail to improve.  Claretta Fraise, M.D.

## 2016-08-12 ENCOUNTER — Other Ambulatory Visit: Payer: Self-pay | Admitting: Family Medicine

## 2016-08-12 ENCOUNTER — Telehealth: Payer: Self-pay | Admitting: Family Medicine

## 2016-08-12 DIAGNOSIS — M25511 Pain in right shoulder: Secondary | ICD-10-CM

## 2016-08-12 NOTE — Telephone Encounter (Signed)
Patient called stating that pain is not getting any better and would like to have an MRI

## 2016-08-12 NOTE — Telephone Encounter (Signed)
I agree. MRI Ordered

## 2016-08-12 NOTE — Telephone Encounter (Signed)
Patient aware.

## 2016-08-18 ENCOUNTER — Ambulatory Visit (HOSPITAL_COMMUNITY)
Admission: RE | Admit: 2016-08-18 | Discharge: 2016-08-18 | Disposition: A | Discharge status: Home / Self Care | Payer: Medicare Other | Source: Ambulatory Visit | Attending: Family Medicine | Admitting: Family Medicine

## 2016-08-18 DIAGNOSIS — M7551 Bursitis of right shoulder: Secondary | ICD-10-CM | POA: Diagnosis not present

## 2016-08-18 DIAGNOSIS — M75101 Unspecified rotator cuff tear or rupture of right shoulder, not specified as traumatic: Secondary | ICD-10-CM | POA: Diagnosis not present

## 2016-08-18 DIAGNOSIS — M12811 Other specific arthropathies, not elsewhere classified, right shoulder: Secondary | ICD-10-CM | POA: Diagnosis not present

## 2016-08-18 DIAGNOSIS — M25511 Pain in right shoulder: Secondary | ICD-10-CM | POA: Insufficient documentation

## 2016-08-19 ENCOUNTER — Other Ambulatory Visit: Payer: Self-pay | Admitting: Family Medicine

## 2016-08-19 DIAGNOSIS — M25511 Pain in right shoulder: Secondary | ICD-10-CM

## 2016-08-22 DIAGNOSIS — M179 Osteoarthritis of knee, unspecified: Secondary | ICD-10-CM | POA: Diagnosis not present

## 2016-08-22 DIAGNOSIS — M25511 Pain in right shoulder: Secondary | ICD-10-CM | POA: Diagnosis not present

## 2016-08-22 DIAGNOSIS — M47816 Spondylosis without myelopathy or radiculopathy, lumbar region: Secondary | ICD-10-CM | POA: Diagnosis not present

## 2016-08-22 DIAGNOSIS — Z79891 Long term (current) use of opiate analgesic: Secondary | ICD-10-CM | POA: Diagnosis not present

## 2016-08-22 DIAGNOSIS — G8929 Other chronic pain: Secondary | ICD-10-CM | POA: Diagnosis not present

## 2016-08-24 ENCOUNTER — Other Ambulatory Visit: Payer: Self-pay | Admitting: Family Medicine

## 2016-08-31 ENCOUNTER — Other Ambulatory Visit: Payer: Self-pay | Admitting: Family Medicine

## 2016-09-01 ENCOUNTER — Other Ambulatory Visit: Payer: Self-pay | Admitting: Family Medicine

## 2016-09-12 DIAGNOSIS — M7541 Impingement syndrome of right shoulder: Secondary | ICD-10-CM | POA: Diagnosis not present

## 2016-10-01 ENCOUNTER — Other Ambulatory Visit: Payer: Self-pay | Admitting: Family Medicine

## 2016-10-05 ENCOUNTER — Telehealth: Payer: Self-pay | Admitting: Family Medicine

## 2016-10-05 NOTE — Telephone Encounter (Signed)
Gave CSV a verbal for the testosterone rx that was sent in yesterday.

## 2016-10-06 ENCOUNTER — Ambulatory Visit (INDEPENDENT_AMBULATORY_CARE_PROVIDER_SITE_OTHER): Payer: Medicare Other | Admitting: Family Medicine

## 2016-10-06 ENCOUNTER — Encounter: Payer: Self-pay | Admitting: Family Medicine

## 2016-10-06 VITALS — BP 145/76 | HR 61 | Temp 97.3°F | Ht 70.0 in | Wt 209.0 lb

## 2016-10-06 DIAGNOSIS — M2550 Pain in unspecified joint: Secondary | ICD-10-CM

## 2016-10-06 DIAGNOSIS — E291 Testicular hypofunction: Secondary | ICD-10-CM | POA: Diagnosis not present

## 2016-10-06 DIAGNOSIS — R5382 Chronic fatigue, unspecified: Secondary | ICD-10-CM

## 2016-10-06 NOTE — Progress Notes (Signed)
   HPI  Patient presents today here for recheck of fatigue, hypogonadism, and arthralgias.  Patient has severe recurrent polyarthralgia. He states that he takes hydrocodone prescribed by pain clinic for this. He states he has aches and pains and a list every joint and muscle group. He states this is been worse since last year when he had his equivocal test for [facility] spotted fever.  Patient was changed on testosterone formulations in July and would like his labs rechecked. He states that he suspicious his fatigue could be from under treatment.  Fatigue Worse over the last 1 year, had a [facility] spotted fever test last July was found to be equivocal. 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 He comes from [place] and has had lots of tick exposure, his son has Lyme disease and has had severe complications due to it.   PMH: Smoking status noted ROS: Per HPI  Objective: BP (!) 145/76   Pulse 61   Temp 97.3 F (36.3 C) (Oral)   Ht 5\' 10"  (1.778 m)   Wt 209 lb (94.8 kg)   BMI 29.99 kg/m  Gen: NAD, alert, cooperative with exam HEENT: NCAT CV: RRR, good S1/S2, no murmur Resp: CTABL, no wheezes, non-labored Ext: No edema, warm Neuro: Alert and oriented, No gross deficits  Assessment and plan:  # Hypogonadism Clinically stable Repeat testosterone and CBC Continue current testosterone dose  # Chronic fatigue Possibly due to hypogonadism, however absolute etiology is unclear CBC, rickettsial labs  # Arthralgias Stable Managed by pain management with hydrocodone Repeating Lyme and RMSF    Orders Placed This Encounter  Procedures  . Lyme Ab/Western Blot Reflex  . [place] [facility] spotted fvr abs pnl(IgG+IgM)  . Testosterone  . CBC with Icard, MD [facility] 10/06/2016, 10:00 AM

## 2016-10-06 NOTE — Patient Instructions (Signed)
Great to see you!   We will call with labs within 1 week 

## 2016-10-07 LAB — ROCKY MTN SPOTTED FVR ABS PNL(IGG+IGM)
RMSF IGG: NEGATIVE
RMSF IGM: 0.8 {index} (ref 0.00–0.89)

## 2016-10-07 LAB — CBC WITH DIFFERENTIAL/PLATELET
BASOS ABS: 0 10*3/uL (ref 0.0–0.2)
Basos: 1 %
EOS (ABSOLUTE): 0.2 10*3/uL (ref 0.0–0.4)
Eos: 4 %
Hematocrit: 41.9 % (ref 37.5–51.0)
Hemoglobin: 14.5 g/dL (ref 12.6–17.7)
IMMATURE GRANS (ABS): 0 10*3/uL (ref 0.0–0.1)
Immature Granulocytes: 1 %
LYMPHS ABS: 1.3 10*3/uL (ref 0.7–3.1)
LYMPHS: 23 %
MCH: 29.2 pg (ref 26.6–33.0)
MCHC: 34.6 g/dL (ref 31.5–35.7)
MCV: 84 fL (ref 79–97)
MONOS ABS: 0.5 10*3/uL (ref 0.1–0.9)
Monocytes: 9 %
NEUTROS ABS: 3.5 10*3/uL (ref 1.4–7.0)
Neutrophils: 62 %
PLATELETS: 203 10*3/uL (ref 150–379)
RBC: 4.97 x10E6/uL (ref 4.14–5.80)
RDW: 13.4 % (ref 12.3–15.4)
WBC: 5.5 10*3/uL (ref 3.4–10.8)

## 2016-10-07 LAB — LYME AB/WESTERN BLOT REFLEX: LYME DISEASE AB, QUANT, IGM: <0.8 index (ref 0.00–0.79)

## 2016-10-07 LAB — TESTOSTERONE: TESTOSTERONE: 281 ng/dL (ref 264–916)

## 2016-10-10 DIAGNOSIS — M7541 Impingement syndrome of right shoulder: Secondary | ICD-10-CM | POA: Diagnosis not present

## 2016-10-20 ENCOUNTER — Telehealth: Payer: Self-pay | Admitting: Family Medicine

## 2016-10-20 MED ORDER — TESTOSTERONE 30 MG/ACT TD SOLN: TRANSDERMAL | 0 refills | Status: DC

## 2016-10-20 NOTE — Telephone Encounter (Signed)
Pt is in [place] Could not get Testosterone filled before leaving Will not be back until mid Jan Per Dr Wendi Snipes okay to fax rx RX printed and faxed to [facility] per pt request

## 2016-10-31 ENCOUNTER — Other Ambulatory Visit: Payer: Self-pay | Admitting: Family Medicine

## 2016-11-08 ENCOUNTER — Other Ambulatory Visit: Payer: Self-pay | Admitting: Family Medicine

## 2016-11-23 DIAGNOSIS — Z79891 Long term (current) use of opiate analgesic: Secondary | ICD-10-CM | POA: Diagnosis not present

## 2016-11-23 DIAGNOSIS — G8929 Other chronic pain: Secondary | ICD-10-CM | POA: Diagnosis not present

## 2016-11-23 DIAGNOSIS — M179 Osteoarthritis of knee, unspecified: Secondary | ICD-10-CM | POA: Diagnosis not present

## 2016-11-23 DIAGNOSIS — M25511 Pain in right shoulder: Secondary | ICD-10-CM | POA: Diagnosis not present

## 2016-11-23 DIAGNOSIS — M47816 Spondylosis without myelopathy or radiculopathy, lumbar region: Secondary | ICD-10-CM | POA: Diagnosis not present

## 2016-11-24 ENCOUNTER — Telehealth: Payer: Self-pay | Admitting: Family Medicine

## 2016-11-24 NOTE — Telephone Encounter (Signed)
Pt had shot at [facility] but unsure of the date Pt will check with them and call back to schedule

## 2016-12-02 ENCOUNTER — Telehealth: Payer: Self-pay | Admitting: *Deleted

## 2016-12-02 NOTE — Telephone Encounter (Signed)
Austin Huber called the evening clinic and reported that he had a piece of steel in his eye. He believes it has been there since yesterday but he wasn't able to see it until today. I advised that we aren't equipped to remove objects from the eye and we didn't have a slit lamp or xray services in the evening clinic to evaluate whether an attempted removal was successful. If this was during the day I would recommend an ophthalmologist but since it's after business hours I suggested the ER. They can do films or a CT to evaluate the orbit. I explained that developing an infection would be a concern. At the time of the conversation patient was unsure of what he was going to do.  Discussed with Chevis Pretty, FNP and she agreed.

## 2016-12-05 DIAGNOSIS — T1502XA Foreign body in cornea, left eye, initial encounter: Secondary | ICD-10-CM | POA: Diagnosis not present

## 2016-12-29 ENCOUNTER — Other Ambulatory Visit: Payer: Self-pay

## 2016-12-29 MED ORDER — FAMOTIDINE 20 MG PO TABS: 20.0000 mg | ORAL_TABLET | Freq: Two times a day (BID) | ORAL | 0 refills | Status: DC

## 2017-01-03 DIAGNOSIS — H52223 Regular astigmatism, bilateral: Secondary | ICD-10-CM | POA: Diagnosis not present

## 2017-01-03 DIAGNOSIS — H5203 Hypermetropia, bilateral: Secondary | ICD-10-CM | POA: Diagnosis not present

## 2017-01-03 DIAGNOSIS — H17822 Peripheral opacity of cornea, left eye: Secondary | ICD-10-CM | POA: Diagnosis not present

## 2017-02-17 ENCOUNTER — Other Ambulatory Visit: Payer: Self-pay | Admitting: Family Medicine

## 2017-02-22 ENCOUNTER — Other Ambulatory Visit: Payer: Self-pay | Admitting: Family Medicine

## 2017-03-28 ENCOUNTER — Other Ambulatory Visit: Payer: Self-pay | Admitting: Family Medicine

## 2017-04-12 ENCOUNTER — Other Ambulatory Visit: Payer: Self-pay | Admitting: Family Medicine

## 2017-05-22 ENCOUNTER — Other Ambulatory Visit: Payer: Self-pay | Admitting: Family Medicine

## 2017-05-28 ENCOUNTER — Other Ambulatory Visit: Payer: Self-pay | Admitting: Family Medicine

## 2017-05-29 NOTE — Telephone Encounter (Signed)
Last seen 10/06/16  Dr Wendi Snipes

## 2017-06-29 ENCOUNTER — Other Ambulatory Visit: Payer: Self-pay | Admitting: Family Medicine

## 2017-06-30 NOTE — Telephone Encounter (Signed)
Last seen 10/06/16  Dr Wendi Snipes

## 2017-08-24 ENCOUNTER — Ambulatory Visit (INDEPENDENT_AMBULATORY_CARE_PROVIDER_SITE_OTHER): Payer: Medicare Other | Admitting: Family Medicine

## 2017-08-24 ENCOUNTER — Encounter: Payer: Self-pay | Admitting: Family Medicine

## 2017-08-24 VITALS — BP 122/65 | HR 70 | Temp 98.0°F | Ht 70.0 in | Wt 209.0 lb

## 2017-08-24 DIAGNOSIS — Z23 Encounter for immunization: Secondary | ICD-10-CM

## 2017-08-24 DIAGNOSIS — S161XXA Strain of muscle, fascia and tendon at neck level, initial encounter: Secondary | ICD-10-CM

## 2017-08-24 DIAGNOSIS — R52 Pain, unspecified: Secondary | ICD-10-CM

## 2017-08-24 DIAGNOSIS — E349 Endocrine disorder, unspecified: Secondary | ICD-10-CM | POA: Diagnosis not present

## 2017-08-24 DIAGNOSIS — K2100 Gastro-esophageal reflux disease with esophagitis, without bleeding: Secondary | ICD-10-CM

## 2017-08-24 MED ORDER — DULOXETINE HCL 30 MG PO CPEP: 30.0000 mg | ORAL_CAPSULE | Freq: Two times a day (BID) | ORAL | 1 refills | Status: AC

## 2017-08-24 MED ORDER — LOSARTAN POTASSIUM 100 MG PO TABS: 100.0000 mg | ORAL_TABLET | Freq: Every day | ORAL | 0 refills | Status: AC

## 2017-08-24 MED ORDER — TESTOSTERONE 30 MG/ACT TD SOLN: TRANSDERMAL | 5 refills | Status: AC

## 2017-08-24 MED ORDER — OXYBUTYNIN 3.9 MG/24HR TD PTTW: MEDICATED_PATCH | TRANSDERMAL | 12 refills | Status: DC

## 2017-08-24 MED ORDER — OXYBUTYNIN CHLORIDE 5 MG PO TABS: 5.0000 mg | ORAL_TABLET | Freq: Two times a day (BID) | ORAL | 1 refills | Status: DC

## 2017-08-24 MED ORDER — CYCLOBENZAPRINE HCL 10 MG PO TABS: 10.0000 mg | ORAL_TABLET | Freq: Three times a day (TID) | ORAL | 5 refills | Status: AC | PRN

## 2017-08-24 MED ORDER — FAMOTIDINE 20 MG PO TABS: 20.0000 mg | ORAL_TABLET | Freq: Two times a day (BID) | ORAL | 1 refills | Status: DC

## 2017-08-24 MED ORDER — ZOLPIDEM TARTRATE 10 MG PO TABS
10.0000 mg | ORAL_TABLET | Freq: Every day | ORAL | 5 refills | Status: AC
Start: 2017-08-24 — End: 2020-09-23

## 2017-08-24 NOTE — Progress Notes (Addendum)
Subjective:  Patient ID: Austin Huber, male    DOB: October 06, 1956  Age: 61 y.o. MRN: [id number]  CC: Follow-up (pt here today for routine follow up of his chronic medical conditions and wants to discuss pain management referral)   HPI Austin Huber presents for Reestablish care here. He had transferred to Dr. Stark Jock in [place]. However Dr. Stark Jock would not give him a referral to a new pain clinic so he decided to come back here. He continues to use his medicines as prior to his departure with the exception of testosterone which was increased to 3 pumps daily. He says that theditropan gives him a lot of heartburn so he would like to try the patch. Chocolate gives him a great deal of heartburn as well. He's had to quit eating this completely.     Follow up for testosterone deficiency: Pt. Using medication as directed. Denies any sx referrable to DVT such as edema or erythema of legs. No dyspnea or chest pain. Energy level reported as being good. Libido is normal and denies E.D. Feels strength is adequate and improved from baseline. Now using 3 pumps daily of Axiron generic equivalent  Otherwise he is doing well. Pain is under adequate control as long as he can get his meds. However he did not like Dr. Leontine Locket to whom he had been referred by Dr. Minna Antis. This is because Dr. Leontine Locket wanted to do ESI and that left his legs numb from the waist down for several hours. He also insisted on having an MRI of his back done. That revealed a herniated disc and evidence for old surgery. Report reviewed with the patient. Available through care everywhere. His controlled substance Registry report was checked throughPMP Aware. No irregularities were noted other than an understandable switching providers at the time of the closure of comprehensive pain management practice and the transfer to Dr. Leontine Locket  Depression screen [facility] 2/9 08/24/2017 10/06/2016 08/09/2016  Decreased Interest 0 0 0  Down, Depressed, Hopeless 0 0 0   PHQ - 2 Score 0 0 0  Altered sleeping - - -  Tired, decreased energy - - -  Change in appetite - - -  Feeling bad or failure about yourself  - - -  Trouble concentrating - - -  Moving slowly or fidgety/restless - - -  Suicidal thoughts - - -  PHQ-9 Score - - -    History Austin Huber has a past medical history of Barrett esophagus and GERD (gastroesophageal reflux disease).   He has a past surgical history that includes Spine surgery and Ganglion cyst excision.   His family history includes Aneurysm in his father; Aortic aneurysm in his mother; Diabetes in his brother, brother, and sister; Heart disease in his brother and brother; Hypertension in his mother; Stroke in his father.He reports that he quit smoking about 3 years ago. He started smoking about 39 years ago. He has never used smokeless tobacco. He reports that he does not drink alcohol or use drugs.    ROS Review of Systems  Constitutional: Negative for chills, diaphoresis, fever and unexpected weight change.  HENT: Negative for congestion, hearing loss, rhinorrhea and sore throat.   Eyes: Negative for visual disturbance.  Respiratory: Negative for cough and shortness of breath.   Cardiovascular: Negative for chest pain.  Gastrointestinal: Negative for abdominal pain, constipation and diarrhea.  Genitourinary: Negative for dysuria and flank pain.  Musculoskeletal: Negative for arthralgias and joint swelling.  Skin: Negative for rash.  Neurological: Negative for dizziness  and headaches.  Psychiatric/Behavioral: Negative for dysphoric mood and sleep disturbance.    Objective:  BP 122/65   Pulse 70   Temp 98 F (36.7 C) (Oral)   Ht 5\' 10"  (1.778 m)   Wt 209 lb (94.8 kg)   BMI 29.99 kg/m   BP Readings from Last 3 Encounters:  08/24/17 122/65  10/06/16 (!) 145/76  08/09/16 117/68    Wt Readings from Last 3 Encounters:  08/24/17 209 lb (94.8 kg)  10/06/16 209 lb (94.8 kg)  08/09/16 211 lb (95.7 kg)     Physical  Exam  Constitutional: He is oriented to person, place, and time. He appears well-developed and well-nourished. No distress.  HENT:  Head: Normocephalic and atraumatic.  Right Ear: External ear normal.  Left Ear: External ear normal.  Nose: Nose normal.  Mouth/Throat: Oropharynx is clear and moist.  Eyes: Pupils are equal, round, and reactive to light. Conjunctivae and EOM are normal.  Neck: Normal range of motion. Neck supple. No thyromegaly present.  Cardiovascular: Normal rate, regular rhythm and normal heart sounds.   No murmur heard. Pulmonary/Chest: Effort normal and breath sounds normal. No respiratory distress. He has no wheezes. He has no rales.  Abdominal: Soft. Bowel sounds are normal. He exhibits no distension. There is no tenderness.  Lymphadenopathy:    He has no cervical adenopathy.  Neurological: He is alert and oriented to person, place, and time. He has normal reflexes.  Skin: Skin is warm and dry.  Psychiatric: He has a normal mood and affect. His behavior is normal. Judgment and thought content normal.      Assessment & Plan:   Jahson was seen today for follow-up.  Diagnoses and all orders for this visit:  Pain management -     Ambulatory referral to Pain Clinic  Need for immunization against influenza -     Flu Vaccine QUAD 36+ mos IM  Neck muscle strain, initial encounter Comments: Patient already has meloxicam and recommended for him to do Flexeril as well and stretching and heat Orders: -     cyclobenzaprine (FLEXERIL) 10 MG tablet; Take 1 tablet (10 mg total) by mouth 3 (three) times daily as needed for muscle spasms.  Testosterone deficiency  Gastroesophageal reflux disease with esophagitis  Other orders -     DULoxetine (CYMBALTA) 30 MG capsule; Take 1 capsule (30 mg total) by mouth 2 (two) times daily. -     famotidine (PEPCID) 20 MG tablet; Take 1 tablet (20 mg total) by mouth 2 (two) times daily. -     losartan (COZAAR) 100 MG tablet; Take 1  tablet (100 mg total) by mouth daily. -     Discontinue: oxybutynin (DITROPAN) 5 MG tablet; Take 1 tablet (5 mg total) by mouth 2 (two) times daily. -     Testosterone 30 MG/ACT SOLN; APPLY 1 PUMP UNDER EACH ARM DAILY -     zolpidem (AMBIEN) 10 MG tablet; Take 1 tablet (10 mg total) by mouth at bedtime. -     oxybutynin (OXYTROL) 3.9 MG/24HR; Apply every 3 1/2 days       I have discontinued Mr. Elms oxybutynin, valsartan, and oxybutynin. I have also changed his DULoxetine, famotidine, losartan, and zolpidem. Additionally, I am having him start on oxybutynin. Lastly, I am having him maintain his triamcinolone cream, HYDROcodone-acetaminophen, esomeprazole, cyclobenzaprine, and Testosterone.  Allergies as of 08/24/2017      Reactions   Lansoprazole Diarrhea   Other Hives   Omeprazole Diarrhea  Medication List       Accurate as of 08/24/17  4:25 PM. Always use your most recent med list.          cyclobenzaprine 10 MG tablet Commonly known as:  FLEXERIL Take 1 tablet (10 mg total) by mouth 3 (three) times daily as needed for muscle spasms.   DULoxetine 30 MG capsule Commonly known as:  CYMBALTA Take 1 capsule (30 mg total) by mouth 2 (two) times daily.   esomeprazole 40 MG capsule Commonly known as:  NEXIUM TAKE ONE CAPSULE BY MOUTH EVERY DAY AT 12 NOON   famotidine 20 MG tablet Commonly known as:  PEPCID Take 1 tablet (20 mg total) by mouth 2 (two) times daily.   HYDROcodone-acetaminophen 10-325 MG tablet Commonly known as:  NORCO Take 1 tablet by mouth 2 (two) times daily.   losartan 100 MG tablet Commonly known as:  COZAAR Take 1 tablet (100 mg total) by mouth daily.   oxybutynin 3.9 MG/24HR Commonly known as:  OXYTROL Apply every 3 1/2 days   Testosterone 30 MG/ACT Soln APPLY 1 PUMP UNDER EACH ARM DAILY   triamcinolone cream 0.1 % Commonly known as:  KENALOG Apply 1 application topically 2 (two) times daily.   zolpidem 10 MG tablet Commonly known  as:  AMBIEN Take 1 tablet (10 mg total) by mouth at bedtime.      Records to be requested from his previous provider. He states blood work is up-to-date about 3 months prior. Therefore we'll review that as opposed to ordering new tests until his next follow-up. Influenza vaccine administered.  Follow-up: Return in about 6 months (around 02/22/2018).  Claretta Fraise, M.D.

## 2017-08-24 NOTE — Addendum Note (Signed)
Addended by: Claretta Fraise on: 08/24/2017 04:26 PM   Modules accepted: Orders

## 2017-08-29 ENCOUNTER — Telehealth: Payer: Self-pay

## 2017-08-29 ENCOUNTER — Other Ambulatory Visit: Payer: Self-pay | Admitting: Family Medicine

## 2017-08-29 DIAGNOSIS — M545 Low back pain: Secondary | ICD-10-CM | POA: Diagnosis not present

## 2017-08-29 DIAGNOSIS — Z79899 Other long term (current) drug therapy: Secondary | ICD-10-CM | POA: Diagnosis not present

## 2017-08-29 DIAGNOSIS — M5136 Other intervertebral disc degeneration, lumbar region: Secondary | ICD-10-CM | POA: Diagnosis not present

## 2017-08-29 DIAGNOSIS — R3915 Urgency of urination: Principal | ICD-10-CM

## 2017-08-29 DIAGNOSIS — N401 Enlarged prostate with lower urinary tract symptoms: Secondary | ICD-10-CM

## 2017-08-29 NOTE — Telephone Encounter (Signed)
Please add dx

## 2017-08-29 NOTE — Telephone Encounter (Signed)
Getting a prior auth for Oxytrol patch  I do not see a DX in history or problem list ??

## 2017-08-29 NOTE — Telephone Encounter (Signed)
Use BPH with urinary urgency (added to problem list)

## 2017-08-30 ENCOUNTER — Telehealth: Payer: Self-pay

## 2017-08-30 ENCOUNTER — Other Ambulatory Visit: Payer: Self-pay | Admitting: Family Medicine

## 2017-08-30 MED ORDER — SOLIFENACIN SUCCINATE 10 MG PO TABS: 10.0000 mg | ORAL_TABLET | Freq: Every day | ORAL | 2 refills | Status: DC

## 2017-08-30 NOTE — Telephone Encounter (Signed)
Please contact the patient let him know. I will send in a trial of vesicare.

## 2017-08-30 NOTE — Telephone Encounter (Signed)
Pt aware.

## 2017-08-30 NOTE — Telephone Encounter (Signed)
Insurance denied Oxytrol patch  Must try Vesicare or Oxybutynin ER

## 2017-09-04 ENCOUNTER — Ambulatory Visit (HOSPITAL_COMMUNITY)
Admission: RE | Admit: 2017-09-04 | Discharge: 2017-09-04 | Disposition: A | Discharge status: Home / Self Care | Payer: Medicare Other | Source: Ambulatory Visit | Attending: Family Medicine | Admitting: Family Medicine

## 2017-09-04 ENCOUNTER — Telehealth: Payer: Self-pay | Admitting: Family Medicine

## 2017-09-04 ENCOUNTER — Ambulatory Visit (INDEPENDENT_AMBULATORY_CARE_PROVIDER_SITE_OTHER): Payer: Medicare Other | Admitting: Family Medicine

## 2017-09-04 ENCOUNTER — Encounter: Payer: Self-pay | Admitting: Family Medicine

## 2017-09-04 ENCOUNTER — Ambulatory Visit (INDEPENDENT_AMBULATORY_CARE_PROVIDER_SITE_OTHER): Payer: Medicare Other

## 2017-09-04 VITALS — BP 113/61 | HR 61 | Temp 97.1°F | Ht 70.0 in | Wt 212.0 lb

## 2017-09-04 DIAGNOSIS — D649 Anemia, unspecified: Secondary | ICD-10-CM | POA: Diagnosis not present

## 2017-09-04 DIAGNOSIS — R0602 Shortness of breath: Secondary | ICD-10-CM

## 2017-09-04 LAB — POCT I-STAT CREATININE: CREATININE: 1 mg/dL (ref 0.61–1.24)

## 2017-09-04 MED ORDER — IOPAMIDOL (ISOVUE-370) INJECTION 76%
100.0000 mL | Freq: Once | INTRAVENOUS | Status: AC | PRN
  Administered 2017-09-04: 100 mL via INTRAVENOUS

## 2017-09-04 NOTE — Telephone Encounter (Signed)
appt made for today to check up on this episode

## 2017-09-04 NOTE — Progress Notes (Signed)
Subjective:  Patient ID: Austin Huber, male    DOB: 1956/08/03  Age: 61 y.o. MRN: [id number]  CC: Shortness of Breath (pt here today c/o SOB with exertion on Friday and both shoulders were hurting and on Sunday same thing but he was also sweating.)   HPI Austin Huber presents for Started with sweats for about a week onset right after having his flu shot. 2 days ago he was out for just a mild walk and developed shortness of breath he says he just couldn't seem to catch his breath. He had a heaviness develop any shoulders. And in his sweating spells seem to get worse. He denies having had any chest pain he was not nauseous he was not weak. It was not unilateral in any point. There was no loss of consciousness or dizziness. The shortness of breath is with mild exertion but it is relieved when he sits down to rest. It has been recurrent several times last evening for several minutes to hours each time.  Depression screen [facility] 2/9 08/24/2017 10/06/2016 08/09/2016  Decreased Interest 0 0 0  Down, Depressed, Hopeless 0 0 0  PHQ - 2 Score 0 0 0  Altered sleeping - - -  Tired, decreased energy - - -  Change in appetite - - -  Feeling bad or failure about yourself  - - -  Trouble concentrating - - -  Moving slowly or fidgety/restless - - -  Suicidal thoughts - - -  PHQ-9 Score - - -    History Austin Huber has a past medical history of Barrett esophagus and GERD (gastroesophageal reflux disease).   He has a past surgical history that includes Spine surgery and Ganglion cyst excision.   His family history includes Aneurysm in his father; Aortic aneurysm in his mother; Diabetes in his brother, brother, and sister; Heart disease in his brother and brother; Hypertension in his mother; Stroke in his father.He reports that he quit smoking about 3 years ago. He started smoking about 39 years ago. He has never used smokeless tobacco. He reports that he does not drink alcohol or use drugs.    ROS Review of Systems    Constitutional: Positive for diaphoresis. Negative for chills and fever.  HENT: Negative for rhinorrhea and sore throat.   Respiratory: Negative for cough and shortness of breath.   Cardiovascular: Positive for chest pain.  Gastrointestinal: Negative for abdominal pain.  Musculoskeletal: Positive for arthralgias. Negative for myalgias.  Skin: Negative for rash.  Neurological: Negative for weakness and headaches.    Objective:  BP 113/61   Pulse 61   Temp (!) 97.1 F (36.2 C) (Oral)   Ht 5' 10"  (1.778 m)   Wt 212 lb (96.2 kg)   BMI 30.42 kg/m   BP Readings from Last 3 Encounters:  09/04/17 113/61  08/24/17 122/65  10/06/16 (!) 145/76    Wt Readings from Last 3 Encounters:  09/04/17 212 lb (96.2 kg)  08/24/17 209 lb (94.8 kg)  10/06/16 209 lb (94.8 kg)     Physical Exam  Constitutional: He is oriented to person, place, and time. He appears well-developed and well-nourished. No distress.  HENT:  Head: Normocephalic and atraumatic.  Right Ear: External ear normal.  Left Ear: External ear normal.  Nose: Nose normal.  Mouth/Throat: Oropharynx is clear and moist.  Eyes: Pupils are equal, round, and reactive to light. Conjunctivae and EOM are normal.  Neck: Normal range of motion. Neck supple. No thyromegaly present.  Cardiovascular: Normal  rate, regular rhythm and normal heart sounds.   No murmur heard. Pulmonary/Chest: Effort normal and breath sounds normal. No respiratory distress. He has no wheezes. He has no rales.  Abdominal: Soft. Bowel sounds are normal. He exhibits no distension. There is no tenderness.  Lymphadenopathy:    He has no cervical adenopathy.  Neurological: He is alert and oriented to person, place, and time. He has normal reflexes.  Skin: Skin is warm and dry.  Psychiatric: He has a normal mood and affect. His behavior is normal. Judgment and thought content normal.    EKG shows no acute changes, normal sinus rhythm without ischemic change and no  axis deviation and no blocks or abnormal intervals  Assessment & Plan:   Austin Huber was seen today for shortness of breath.  Diagnoses and all orders for this visit:  SOB (shortness of breath) -     EKG 12-Lead -     DG Chest 2 View; Future -     CBC with Differential/Platelet -     CMP14+EGFR -     D-dimer, quantitative (not at [facility]) -     Troponin I -     CT ANGIO CHEST PE W OR WO CONTRAST; Future  Shortness of breath -     CT ANGIO CHEST PE W OR WO CONTRAST; Future       I have discontinued Austin Huber oxybutynin. I am also having him maintain his triamcinolone cream, HYDROcodone-acetaminophen, esomeprazole, cyclobenzaprine, DULoxetine, famotidine, losartan, Testosterone, zolpidem, and solifenacin.  Allergies as of 09/04/2017      Reactions   Lansoprazole Diarrhea   Other Hives   Omeprazole Diarrhea      Medication List       Accurate as of 09/04/17  5:12 PM. Always use your most recent med list.          cyclobenzaprine 10 MG tablet Commonly known as:  FLEXERIL Take 1 tablet (10 mg total) by mouth 3 (three) times daily as needed for muscle spasms.   DULoxetine 30 MG capsule Commonly known as:  CYMBALTA Take 1 capsule (30 mg total) by mouth 2 (two) times daily.   esomeprazole 40 MG capsule Commonly known as:  NEXIUM TAKE ONE CAPSULE BY MOUTH EVERY DAY AT 12 NOON   famotidine 20 MG tablet Commonly known as:  PEPCID Take 1 tablet (20 mg total) by mouth 2 (two) times daily.   HYDROcodone-acetaminophen 10-325 MG tablet Commonly known as:  NORCO Take 1 tablet by mouth 2 (two) times daily.   losartan 100 MG tablet Commonly known as:  COZAAR Take 1 tablet (100 mg total) by mouth daily.   solifenacin 10 MG tablet Commonly known as:  VESICARE Take 1 tablet (10 mg total) by mouth daily. For bladder control   Testosterone 30 MG/ACT Soln APPLY 1 PUMP UNDER EACH ARM DAILY   triamcinolone cream 0.1 % Commonly known as:  KENALOG Apply 1 application topically 2  (two) times daily.   zolpidem 10 MG tablet Commonly known as:  AMBIEN Take 1 tablet (10 mg total) by mouth at bedtime.        Follow-up: Return in about 2 weeks (around 09/18/2017), or if symptoms worsen or fail to improve.  Claretta Fraise, M.D.

## 2017-09-05 ENCOUNTER — Other Ambulatory Visit: Payer: Self-pay | Admitting: *Deleted

## 2017-09-05 ENCOUNTER — Telehealth: Payer: Self-pay | Admitting: Family Medicine

## 2017-09-05 DIAGNOSIS — D649 Anemia, unspecified: Secondary | ICD-10-CM

## 2017-09-05 DIAGNOSIS — R0602 Shortness of breath: Secondary | ICD-10-CM

## 2017-09-05 DIAGNOSIS — I10 Essential (primary) hypertension: Secondary | ICD-10-CM

## 2017-09-05 DIAGNOSIS — R001 Bradycardia, unspecified: Secondary | ICD-10-CM

## 2017-09-05 LAB — CMP14+EGFR
A/G RATIO: 1.6 (ref 1.2–2.2)
ALT: 19 IU/L (ref 0–44)
AST: 21 IU/L (ref 0–40)
Albumin: 4.6 g/dL (ref 3.6–4.8)
Alkaline Phosphatase: 59 IU/L (ref 39–117)
BUN/Creatinine Ratio: 12 (ref 10–24)
BUN: 12 mg/dL (ref 8–27)
Bilirubin Total: 0.3 mg/dL (ref 0.0–1.2)
CALCIUM: 9.2 mg/dL (ref 8.6–10.2)
CO2: 22 mmol/L (ref 20–29)
CREATININE: 1.02 mg/dL (ref 0.76–1.27)
Chloride: 102 mmol/L (ref 96–106)
GFR, EST AFRICAN AMERICAN: 91 mL/min/{1.73_m2} (ref >59)
GFR, EST NON AFRICAN AMERICAN: 79 mL/min/{1.73_m2} (ref >59)
GLUCOSE: 53 mg/dL — AB (ref 65–99)
Globulin, Total: 2.8 g/dL (ref 1.5–4.5)
POTASSIUM: 4.4 mmol/L (ref 3.5–5.2)
Sodium: 143 mmol/L (ref 134–144)
TOTAL PROTEIN: 7.4 g/dL (ref 6.0–8.5)

## 2017-09-05 LAB — CBC WITH DIFFERENTIAL/PLATELET
Basophils Absolute: 0.1 10*3/uL (ref 0.0–0.2)
Basos: 1 %
EOS (ABSOLUTE): 0.2 10*3/uL (ref 0.0–0.4)
Eos: 3 %
Hematocrit: 36.9 % — ABNORMAL LOW (ref 37.5–51.0)
Hemoglobin: 12.1 g/dL — ABNORMAL LOW (ref 13.0–17.7)
Immature Grans (Abs): 0 10*3/uL (ref 0.0–0.1)
Immature Granulocytes: 0 %
Lymphocytes Absolute: 1.6 10*3/uL (ref 0.7–3.1)
Lymphs: 30 %
MCH: 28.8 pg (ref 26.6–33.0)
MCHC: 32.8 g/dL (ref 31.5–35.7)
MCV: 88 fL (ref 79–97)
Monocytes Absolute: 0.5 10*3/uL (ref 0.1–0.9)
Monocytes: 9 %
Neutrophils Absolute: 3 10*3/uL (ref 1.4–7.0)
Neutrophils: 57 %
Platelets: 198 10*3/uL (ref 150–379)
RBC: 4.2 x10E6/uL (ref 4.14–5.80)
RDW: 13.9 % (ref 12.3–15.4)
WBC: 5.3 10*3/uL (ref 3.4–10.8)

## 2017-09-05 LAB — D-DIMER, QUANTITATIVE: D-DIMER: 0.38 mg/L FEU (ref 0.00–0.49)

## 2017-09-05 LAB — TROPONIN I

## 2017-09-05 NOTE — Telephone Encounter (Signed)
Lab results are in pool to call. Please refer as requested

## 2017-09-05 NOTE — Telephone Encounter (Signed)
Patient aware of lab results and referral placed to cardiology

## 2017-09-07 LAB — ANEMIA PROFILE B
Basophils Absolute: 0.1 10*3/uL (ref 0.0–0.2)
Basos: 1 %
EOS (ABSOLUTE): 0.1 10*3/uL (ref 0.0–0.4)
EOS: 3 %
FOLATE: 8.2 ng/mL (ref >3.0)
Ferritin: 38 ng/mL (ref 30–400)
HEMATOCRIT: 38 % (ref 37.5–51.0)
HEMOGLOBIN: 12.1 g/dL — AB (ref 13.0–17.7)
IRON SATURATION: 25 % (ref 15–55)
IRON: 75 ug/dL (ref 38–169)
Immature Grans (Abs): 0 10*3/uL (ref 0.0–0.1)
Immature Granulocytes: 0 %
LYMPHS ABS: 1.6 10*3/uL (ref 0.7–3.1)
Lymphs: 29 %
MCH: 28.8 pg (ref 26.6–33.0)
MCHC: 31.8 g/dL (ref 31.5–35.7)
MCV: 91 fL (ref 79–97)
MONOCYTES: 8 %
Monocytes Absolute: 0.4 10*3/uL (ref 0.1–0.9)
NEUTROS ABS: 3.3 10*3/uL (ref 1.4–7.0)
Neutrophils: 59 %
Platelets: 234 10*3/uL (ref 150–379)
RBC: 4.2 x10E6/uL (ref 4.14–5.80)
RDW: 14.3 % (ref 12.3–15.4)
RETIC CT PCT: 1.3 % (ref 0.6–2.6)
TIBC: 300 ug/dL (ref 250–450)
UIBC: 225 ug/dL (ref 111–343)
VITAMIN B 12: 679 pg/mL (ref 232–1245)
WBC: 5.5 10*3/uL (ref 3.4–10.8)

## 2017-09-07 LAB — SPECIMEN STATUS REPORT

## 2017-09-11 ENCOUNTER — Other Ambulatory Visit: Payer: Self-pay | Admitting: *Deleted

## 2017-09-11 ENCOUNTER — Other Ambulatory Visit: Payer: Medicare Other

## 2017-09-11 DIAGNOSIS — D649 Anemia, unspecified: Secondary | ICD-10-CM | POA: Diagnosis not present

## 2017-09-12 LAB — ERYTHROPOIETIN: ERYTHROPOIETIN: 11.6 m[IU]/mL (ref 2.6–18.5)

## 2017-09-19 ENCOUNTER — Ambulatory Visit: Payer: Medicare Other | Admitting: Cardiovascular Disease

## 2017-09-27 ENCOUNTER — Ambulatory Visit: Payer: Medicare Other | Admitting: Cardiovascular Disease

## 2017-10-02 DIAGNOSIS — M545 Low back pain: Secondary | ICD-10-CM | POA: Diagnosis not present

## 2017-10-02 DIAGNOSIS — Z79899 Other long term (current) drug therapy: Secondary | ICD-10-CM | POA: Diagnosis not present

## 2017-10-02 DIAGNOSIS — M5136 Other intervertebral disc degeneration, lumbar region: Secondary | ICD-10-CM | POA: Diagnosis not present

## 2017-10-04 ENCOUNTER — Other Ambulatory Visit: Payer: Self-pay | Admitting: *Deleted

## 2017-10-27 ENCOUNTER — Other Ambulatory Visit: Payer: Self-pay

## 2017-10-31 MED ORDER — SOLIFENACIN SUCCINATE 10 MG PO TABS: 10.0000 mg | ORAL_TABLET | Freq: Every day | ORAL | 0 refills | Status: DC

## 2017-10-31 NOTE — Addendum Note (Signed)
Addended by: Thana Ates on: 10/31/2017 12:00 PM   Modules accepted: Orders

## 2017-11-02 ENCOUNTER — Ambulatory Visit: Payer: Medicare Other | Admitting: Cardiovascular Disease

## 2018-02-15 ENCOUNTER — Other Ambulatory Visit: Payer: Self-pay | Admitting: Family Medicine

## 2018-02-23 ENCOUNTER — Ambulatory Visit: Payer: Medicare Other | Admitting: Family Medicine

## 2018-05-12 ENCOUNTER — Other Ambulatory Visit: Payer: Self-pay | Admitting: Family Medicine

## 2018-05-20 IMAGING — MR MR SHOULDER*R* W/O CM
4 of 5 series · 19 of 40 positions shown · non-contrast
Comparison: None.

CLINICAL DATA: Right shoulder pain, weakness, and numbness.

EXAM:
MRI OF THE RIGHT SHOULDER WITHOUT CONTRAST
TECHNIQUE: Multiplanar, multisequence MR imaging of the shoulder was performed.
No intravenous contrast was administered.

[Series 4: t2fs coronal · oblique · 3.0mm · 0.26mm/px · 3 of 24 slices shown]
[im 4/24]
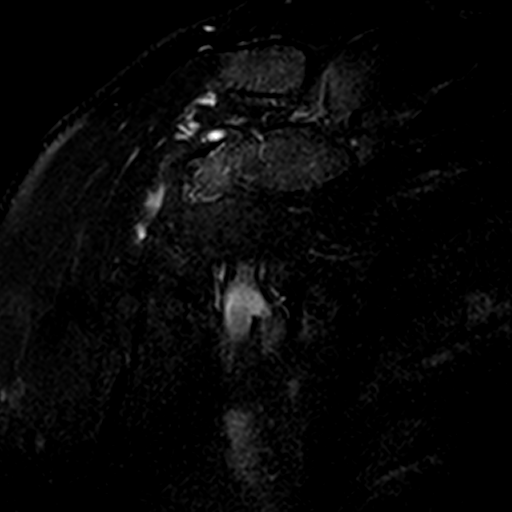
[im 14/24]
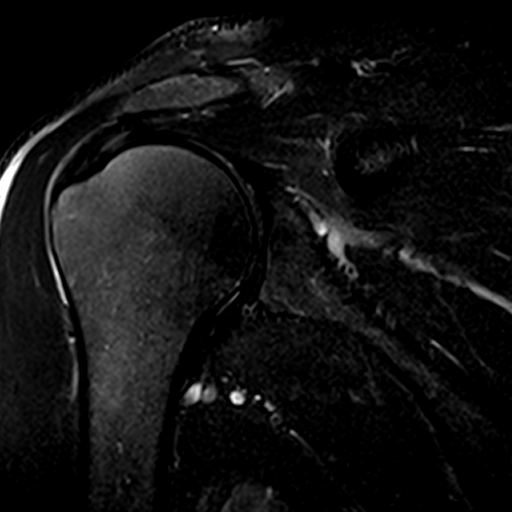
[im 20/24]
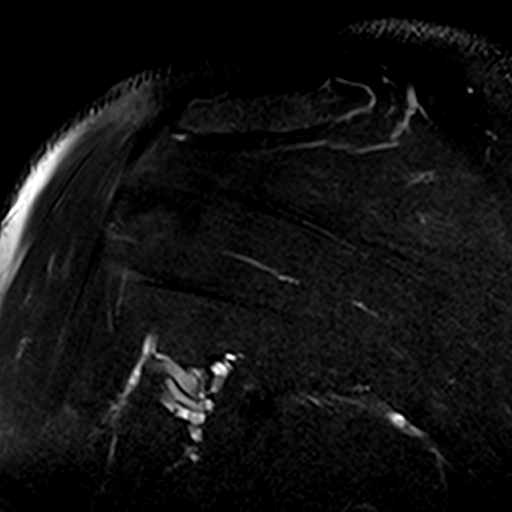

[Series 5: PD · oblique · 3.0mm · 0.25mm/px · 8 of 24 slices shown]
[im 1/24]
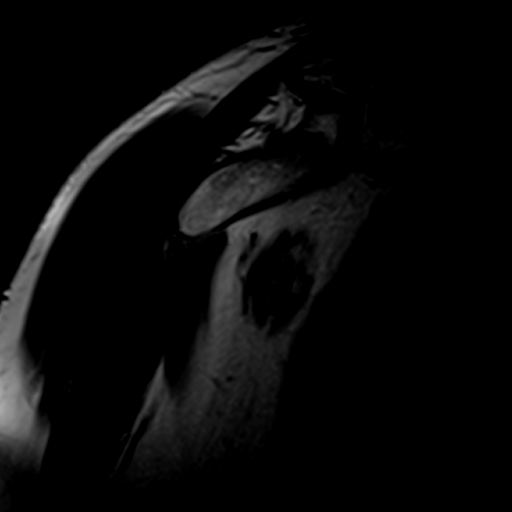
[im 4/24]
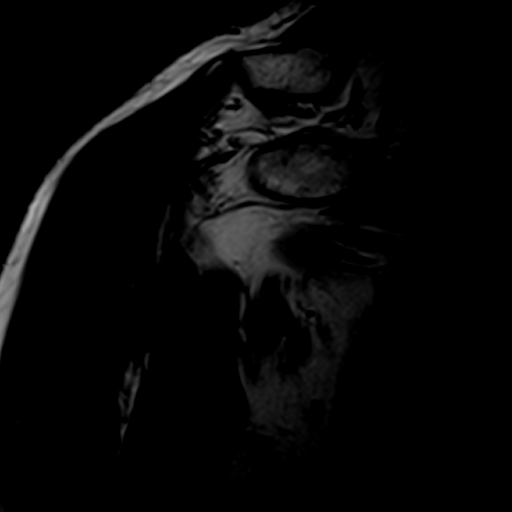
[im 7/24]
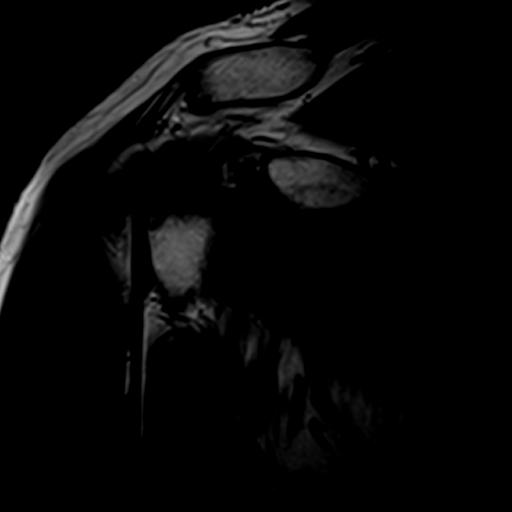
[im 10/24]
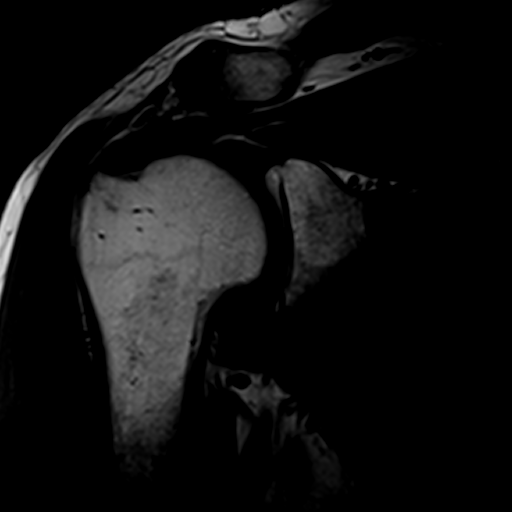
[im 14/24]
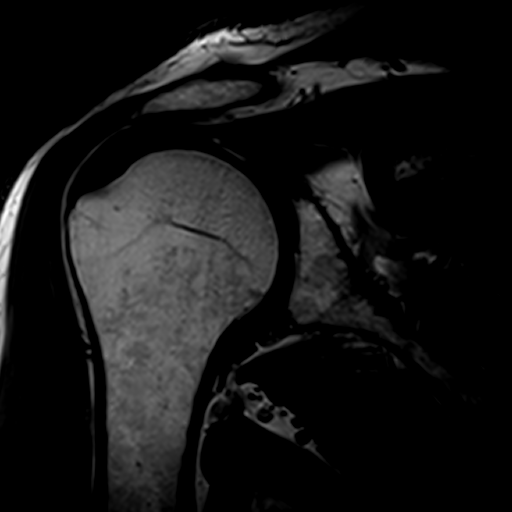
[im 17/24]
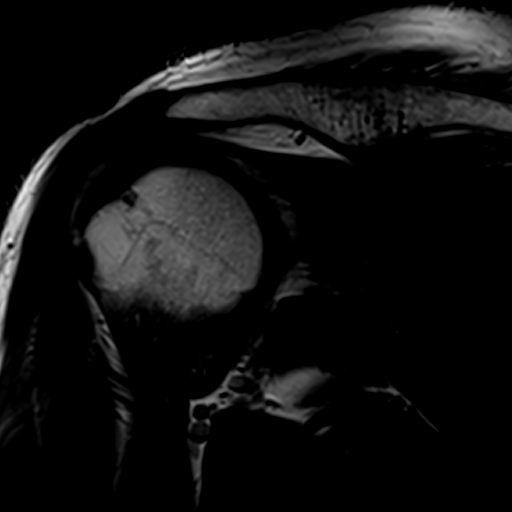
[im 20/24]
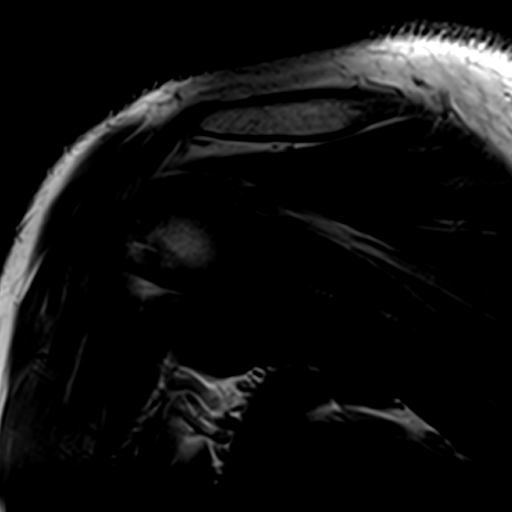
[im 24/24]
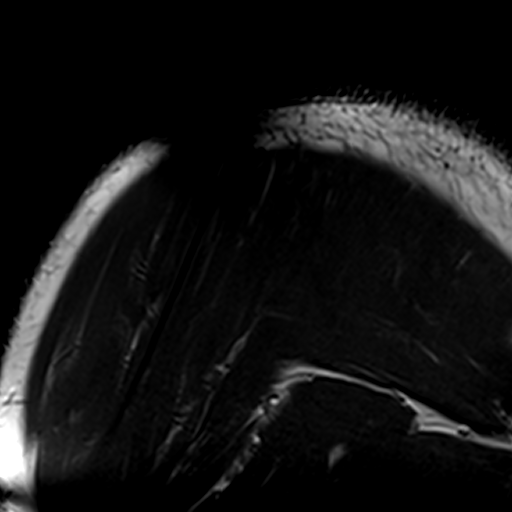

[Series 6: T1 · coronal · 3.0mm · 0.24mm/px · 5 of 26 slices shown]
[im 1/26]
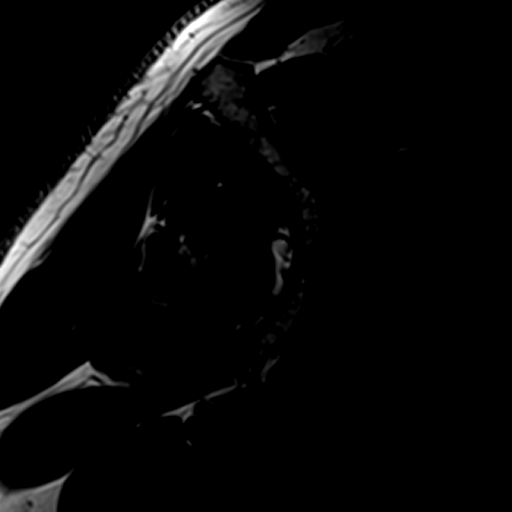
[im 4/26]
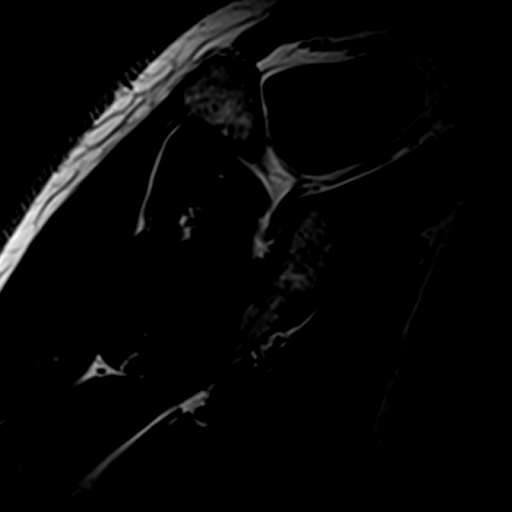
[im 8/26]
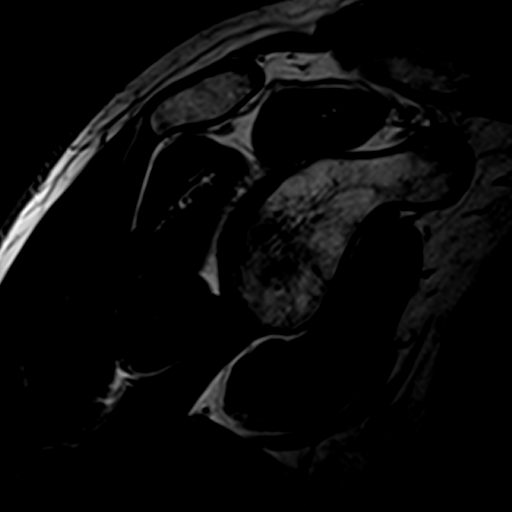
[im 15/26]
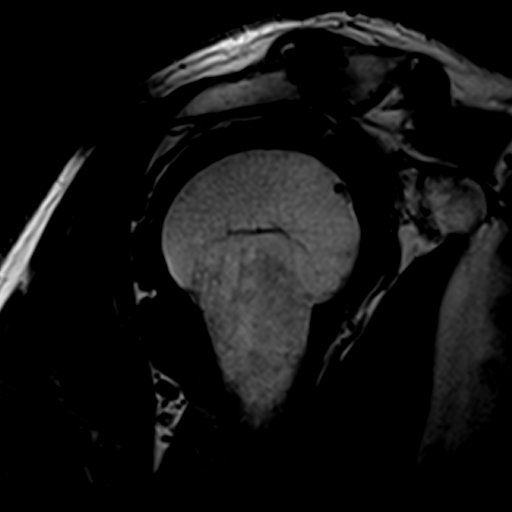
[im 22/26]
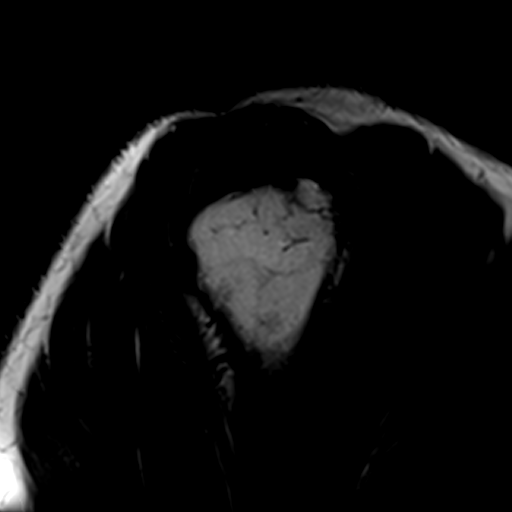

[Series 7: t2fs sagital · coronal · 3.0mm · 0.26mm/px · 3 of 26 slices shown]
[im 4/26]
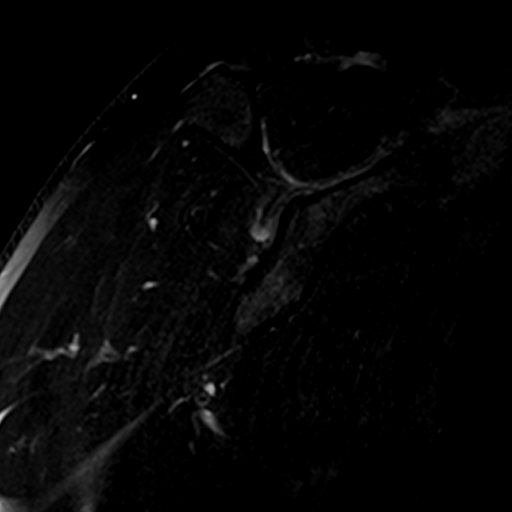
[im 15/26]
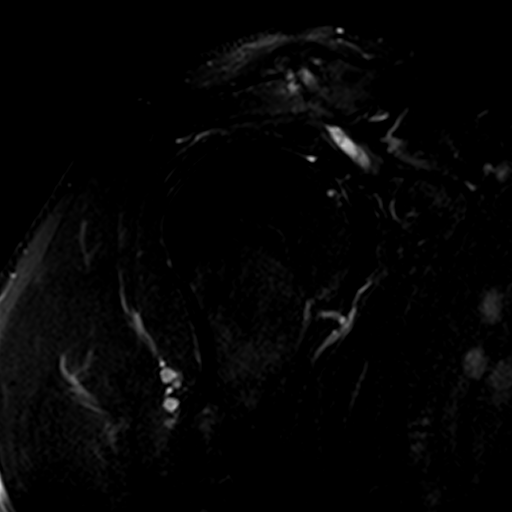
[im 22/26]
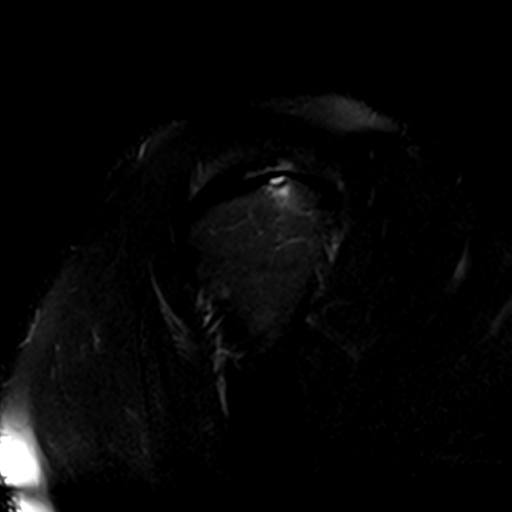

[19 of 40 positions shown; findings below may reference images not displayed]

FINDINGS: Rotator cuff: Mild supraspinatus tendinopathy. Trace adjacent edema
in the greater tuberosity, image [DATE].

Muscles:  Unremarkable

Biceps long head:  Unremarkable

Acromioclavicular Joint: Mild degenerative AC joint arthropathy with
spurring and a small amount of edema within the joint and in the
subcortical marrow. Type I acromion. Mild subacromial subdeltoid
bursitis and mild subcoracoid bursitis.

Glenohumeral Joint: Moderate degenerative chondral thinning in the
glenohumeral joint with mild spurring along the glenoid rim.

Labrum:  Grossly unremarkable

Bones: No significant extra-articular osseous abnormalities
identified.

Other: No supplemental non-categorized findings.
IMPRESSION: 1. Mild supraspinatus tendinopathy.
2. Mild subacromial subdeltoid bursitis and subcoracoid bursitis.
3. Moderate degenerative chondral thinning in the glenohumeral
joint.
4. Mild degenerative AC joint arthropathy.

## 2018-06-22 ENCOUNTER — Other Ambulatory Visit: Payer: Self-pay | Admitting: Family Medicine

## 2018-07-20 ENCOUNTER — Other Ambulatory Visit: Payer: Self-pay | Admitting: Family Medicine

## 2018-07-24 NOTE — Telephone Encounter (Signed)
Patient states to disregard and that he has moved.

## 2018-07-25 ENCOUNTER — Other Ambulatory Visit: Payer: Self-pay | Admitting: Family Medicine

## 2018-07-26 NOTE — Telephone Encounter (Signed)
Last seen 09/04/17

## 2018-10-01 ENCOUNTER — Other Ambulatory Visit: Payer: Self-pay | Admitting: Family Medicine

## 2018-10-15 ENCOUNTER — Other Ambulatory Visit: Payer: Self-pay | Admitting: Family Medicine

## 2018-10-16 NOTE — Telephone Encounter (Signed)
Last seen 09/04/17 Dr Livia Snellen  Needs to be seen

## 2018-10-16 NOTE — Telephone Encounter (Signed)
Spoke to pt and he lives in [place] and has for over a year.

## 2018-11-24 ENCOUNTER — Other Ambulatory Visit: Payer: Self-pay | Admitting: Family Medicine
